# Patient Record
Sex: Male | Born: 1965 | Race: White | Hispanic: No | Marital: Single | Smoking: Current every day smoker
Health system: Southern US, Community
[De-identification: ages and names within clinical notes are randomized; demographics above are authoritative.]

## PROBLEM LIST (undated history)

## (undated) DIAGNOSIS — R479 Unspecified speech disturbances: Secondary | ICD-10-CM

## (undated) DIAGNOSIS — I200 Unstable angina: Secondary | ICD-10-CM

## (undated) HISTORY — DX: Unspecified speech disturbances: R47.9

## (undated) HISTORY — DX: Unstable angina: I20.0

## (undated) HISTORY — PX: EXTERNAL EAR SURGERY: SHX627

---

## 2017-02-23 ENCOUNTER — Emergency Department (HOSPITAL_COMMUNITY)
Admission: EM | Admit: 2017-02-23 | Discharge: 2017-02-23 | Disposition: A | Discharge status: Home / Self Care | Payer: Medicaid Other | Attending: Emergency Medicine | Admitting: Emergency Medicine

## 2017-02-23 ENCOUNTER — Emergency Department (HOSPITAL_COMMUNITY): Payer: Medicaid Other

## 2017-02-23 ENCOUNTER — Encounter (HOSPITAL_COMMUNITY): Payer: Self-pay

## 2017-02-23 DIAGNOSIS — Z7982 Long term (current) use of aspirin: Secondary | ICD-10-CM | POA: Insufficient documentation

## 2017-02-23 DIAGNOSIS — F17210 Nicotine dependence, cigarettes, uncomplicated: Secondary | ICD-10-CM | POA: Insufficient documentation

## 2017-02-23 DIAGNOSIS — H8110 Benign paroxysmal vertigo, unspecified ear: Secondary | ICD-10-CM | POA: Diagnosis not present

## 2017-02-23 DIAGNOSIS — Z79899 Other long term (current) drug therapy: Secondary | ICD-10-CM | POA: Insufficient documentation

## 2017-02-23 DIAGNOSIS — R42 Dizziness and giddiness: Secondary | ICD-10-CM | POA: Diagnosis present

## 2017-02-23 LAB — URINALYSIS, ROUTINE W REFLEX MICROSCOPIC
Bilirubin Urine: NEGATIVE
GLUCOSE, UA: NEGATIVE mg/dL
HGB URINE DIPSTICK: NEGATIVE
Ketones, ur: NEGATIVE mg/dL
LEUKOCYTES UA: NEGATIVE
Nitrite: NEGATIVE
PH: 8 (ref 5.0–8.0)
Protein, ur: NEGATIVE mg/dL
Specific Gravity, Urine: 1.008 (ref 1.005–1.030)

## 2017-02-23 LAB — BASIC METABOLIC PANEL
Anion gap: 6 (ref 5–15)
BUN: 8 mg/dL (ref 6–20)
CHLORIDE: 106 mmol/L (ref 101–111)
CO2: 29 mmol/L (ref 22–32)
CREATININE: 0.85 mg/dL (ref 0.61–1.24)
Calcium: 9.3 mg/dL (ref 8.9–10.3)
GFR calc Af Amer: >60 mL/min (ref >60)
GFR calc non Af Amer: >60 mL/min (ref >60)
Glucose, Bld: 102 mg/dL — ABNORMAL HIGH (ref 65–99)
POTASSIUM: 4.2 mmol/L (ref 3.5–5.1)
SODIUM: 141 mmol/L (ref 135–145)

## 2017-02-23 LAB — CBC
HEMATOCRIT: 41.9 % (ref 39.0–52.0)
Hemoglobin: 14.4 g/dL (ref 13.0–17.0)
MCH: 31.1 pg (ref 26.0–34.0)
MCHC: 34.4 g/dL (ref 30.0–36.0)
MCV: 90.5 fL (ref 78.0–100.0)
Platelets: 191 10*3/uL (ref 150–400)
RBC: 4.63 MIL/uL (ref 4.22–5.81)
RDW: 13.1 % (ref 11.5–15.5)
WBC: 5.7 10*3/uL (ref 4.0–10.5)

## 2017-02-23 MED ORDER — MECLIZINE HCL 25 MG PO TABS: 25.0000 mg | ORAL_TABLET | Freq: Three times a day (TID) | ORAL | 0 refills | Status: DC | PRN

## 2017-02-23 MED ORDER — MECLIZINE HCL 25 MG PO TABS
25.0000 mg | ORAL_TABLET | Freq: Once | ORAL | Status: AC
  Administered 2017-02-23: 25 mg via ORAL
  Filled 2017-02-23: qty 1

## 2017-02-23 NOTE — ED Provider Notes (Signed)
[facility]-EMERGENCY DEPT Provider Note   CSN: [id number] Arrival date & time: 02/23/17  1449     History   Chief Complaint Chief Complaint  Patient presents with  . Dizziness    HPI Willie Price is a 51 y.o. male with no significant History who presents with complaints of dizziness for the past 1.5 years. Patient is poor historian. He describes intermittent dizziness, at times positional. He does not see a primary care physician, nor has he had this dizziness worked up in the past. He denies any weakness, numbness or tingling, recent falls or trauma.He decided to come in today because he was "seeing stars".   HPI  History reviewed. No pertinent past medical history.  There are no active problems to display for this patient.   History reviewed. No pertinent surgical history.     Home Medications    Prior to Admission medications   Medication Sig Start Date End Date Taking? Authorizing Provider  Aspirin-Acetaminophen (GOODY BODY PAIN) 500-325 MG PACK Take 1 Package by mouth as needed (pain).   Yes [provider]  meclizine (ANTIVERT) 25 MG tablet Take 1 tablet (25 mg total) by mouth 3 (three) times daily as needed for dizziness. 02/23/17   Wynelle ClevelandMizera, Jeff Mccallum, MD    Family History History reviewed. No pertinent family history.  Social History Social History  Substance Use Topics  . Smoking status: Current Every Day Smoker    Packs/day: 0.50  . Smokeless tobacco: Current User    Types: Chew  . Alcohol use Yes     Comment: occ     Allergies   Patient has no known allergies.   Review of Systems Review of Systems  Constitutional: Negative for chills and fever.  HENT: Negative for ear pain and sore throat.   Eyes: Negative for pain and visual disturbance.  Respiratory: Negative for cough and shortness of breath.   Cardiovascular: Negative for chest pain and palpitations.  Gastrointestinal: Negative for abdominal pain and vomiting.  Genitourinary: Negative  for dysuria and hematuria.  Musculoskeletal: Negative for arthralgias and back pain.  Skin: Negative for color change and rash.  Neurological: Positive for dizziness. Negative for seizures and syncope.  All other systems reviewed and are negative.    Physical Exam Updated Vital Signs BP 115/72   Pulse 79   Temp 97.8 F (36.6 C)   Resp 20   SpO2 99%   Physical Exam  Constitutional: He is oriented to person, place, and time. He appears well-developed and well-nourished.  HENT:  Head: Normocephalic and atraumatic.  Eyes: Conjunctivae are normal.  Neck: Neck supple.  Cardiovascular: Normal rate and regular rhythm.   No murmur heard. Pulmonary/Chest: Effort normal and breath sounds normal. No respiratory distress.  Abdominal: Soft. There is no tenderness.  Musculoskeletal: He exhibits no edema.  Neurological: He is alert and oriented to person, place, and time. He has normal strength. No cranial nerve deficit or sensory deficit. He displays a negative Romberg sign. Coordination and gait normal. GCS eye subscore is 4. GCS verbal subscore is 5. GCS motor subscore is 6.  Horizontal nystagmus elicited with changes in position.   Skin: Skin is warm and dry.  Psychiatric: He has a normal mood and affect.  Nursing note and vitals reviewed.    ED Treatments / Results  Labs (all labs ordered are listed, but only abnormal results are displayed) Labs Reviewed  BASIC METABOLIC PANEL - Abnormal; Notable for the following:       Result  Value   Glucose, Bld 102 (*)    All other components within normal limits  URINALYSIS, ROUTINE W REFLEX MICROSCOPIC - Abnormal; Notable for the following:    Color, Urine STRAW (*)    All other components within normal limits  CBC  CBG MONITORING, ED    EKG  EKG Interpretation None       Radiology Ct Head Wo Contrast  Result Date: 02/23/2017 CLINICAL DATA:  Dizziness EXAM: CT HEAD WITHOUT CONTRAST TECHNIQUE: Contiguous axial images were  obtained from the base of the skull through the vertex without intravenous contrast. COMPARISON:  None. FINDINGS: Brain: No acute territorial infarction, hemorrhage or intracranial mass is seen. The ventricles are nonenlarged. Vascular: No hyperdense vessels. Scattered calcifications at the carotid siphons. Skull: Normal. Negative for fracture or focal lesion. Sinuses/Orbits: Completely opacified left maxillary sinus. Mucosal thickening in the ethmoid and sphenoid sinuses. No acute orbital abnormality. Other: None IMPRESSION: No CT evidence for acute intracranial abnormality. Electronically Signed   By: Jasmine Pang M.D.   On: 02/23/2017 22:33    Procedures Procedures (including critical care time)  Medications Ordered in ED Medications  meclizine (ANTIVERT) tablet 25 mg (25 mg Oral Given 02/23/17 2048)     Initial Impression / Assessment and Plan / ED Course  I have reviewed the triage vital signs and the nursing notes.  Pertinent labs & imaging results that were available during my care of the patient were reviewed by me and considered in my medical decision making (see chart for details).    Patient is a 51 year old male who presents with complaints of intermittent dizziness for the past 1.5 years. Patient is somewhat poor historian. He arrived hemodynamically stable, in no acute distress. Exam as above, significant for positional reproducing symptoms with horizontal nystagmus.  Exam and nature of symptoms more consistent with peripheral vertigo. However given patient is poor historian, and duration of symptoms, CT head obtained, which was negative for acute findings. Patient's symptoms improved with meclizine. He was able to walk without difficulty. Patient given referral for primary care physician, as he would benefit for some exercises for BPPV and close follow up. Return precautions discussed. Patient in agreement with plan at time of discharge.  Patient and plan of care discussed with  Attending physician, Dr. Jacqulyn Bath.    Final Clinical Impressions(s) / ED Diagnoses   Final diagnoses:  BPPV (benign paroxysmal positional vertigo), unspecified laterality    New Prescriptions Discharge Medication List as of 02/23/2017 10:47 PM    START taking these medications   Details  meclizine (ANTIVERT) 25 MG tablet Take 1 tablet (25 mg total) by mouth 3 (three) times daily as needed for dizziness., Starting Thu 02/23/2017, Print            Wynelle Cleveland, MD 02/24/17 1610    Maia Plan, MD 02/24/17 [phone]

## 2017-02-23 NOTE — ED Notes (Signed)
Pt departed in NAD.  

## 2017-02-23 NOTE — ED Notes (Signed)
ED Provider at bedside. 

## 2017-02-23 NOTE — ED Triage Notes (Signed)
[facility]- pt here from home complaining of dizziness. Pt has had intermittent dizziness X1 year. Pt states he was watching his tv show today and noticed dizziness. Pt aoX4, no unilateral weakness noted. Vitals stable with EMS.

## 2017-02-23 NOTE — ED Notes (Signed)
Pt able to ambulate to bathroom and back without difficulty and only standby assistance. MD informed.

## 2017-02-23 NOTE — ED Notes (Signed)
Patient transported to CT 

## 2017-02-23 NOTE — Discharge Instructions (Signed)
Your work up was reassuring today. Please call number provided to set up a primary care doctor. Return to ED with any new or worsening symptoms.

## 2017-06-16 ENCOUNTER — Encounter (HOSPITAL_COMMUNITY): Payer: Self-pay

## 2017-06-16 ENCOUNTER — Emergency Department (HOSPITAL_COMMUNITY): Payer: Medicaid Other

## 2017-06-16 ENCOUNTER — Emergency Department (HOSPITAL_COMMUNITY)
Admission: EM | Admit: 2017-06-16 | Discharge: 2017-06-16 | Disposition: A | Discharge status: Home / Self Care | Payer: Medicaid Other | Attending: Emergency Medicine | Admitting: Emergency Medicine

## 2017-06-16 ENCOUNTER — Other Ambulatory Visit: Payer: Self-pay

## 2017-06-16 DIAGNOSIS — M25562 Pain in left knee: Secondary | ICD-10-CM | POA: Diagnosis present

## 2017-06-16 DIAGNOSIS — F17200 Nicotine dependence, unspecified, uncomplicated: Secondary | ICD-10-CM | POA: Diagnosis not present

## 2017-06-16 DIAGNOSIS — Z79899 Other long term (current) drug therapy: Secondary | ICD-10-CM | POA: Insufficient documentation

## 2017-06-16 DIAGNOSIS — Y999 Unspecified external cause status: Secondary | ICD-10-CM | POA: Insufficient documentation

## 2017-06-16 DIAGNOSIS — Y939 Activity, unspecified: Secondary | ICD-10-CM | POA: Diagnosis not present

## 2017-06-16 DIAGNOSIS — M25561 Pain in right knee: Secondary | ICD-10-CM | POA: Insufficient documentation

## 2017-06-16 DIAGNOSIS — Y929 Unspecified place or not applicable: Secondary | ICD-10-CM | POA: Insufficient documentation

## 2017-06-16 MED ORDER — CYCLOBENZAPRINE HCL 10 MG PO TABS: 10.0000 mg | ORAL_TABLET | Freq: Two times a day (BID) | ORAL | 0 refills | Status: DC | PRN

## 2017-06-16 MED ORDER — IBUPROFEN 800 MG PO TABS
800.0000 mg | ORAL_TABLET | Freq: Once | ORAL | Status: AC
  Administered 2017-06-16: 800 mg via ORAL
  Filled 2017-06-16: qty 1
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
## 2017-06-16 NOTE — ED Notes (Signed)
Pt is ambulatory with a steady gait.  
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
## 2017-07-13 ENCOUNTER — Ambulatory Visit: Payer: Medicaid Other | Attending: Orthopaedic Surgery

## 2017-07-13 DIAGNOSIS — R252 Cramp and spasm: Secondary | ICD-10-CM | POA: Insufficient documentation

## 2017-07-13 DIAGNOSIS — M545 Low back pain, unspecified: Secondary | ICD-10-CM

## 2017-07-13 DIAGNOSIS — M25661 Stiffness of right knee, not elsewhere classified: Secondary | ICD-10-CM | POA: Insufficient documentation

## 2017-07-13 DIAGNOSIS — R262 Difficulty in walking, not elsewhere classified: Secondary | ICD-10-CM | POA: Diagnosis present

## 2017-07-13 DIAGNOSIS — M62830 Muscle spasm of back: Secondary | ICD-10-CM | POA: Diagnosis present
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
## 2017-07-25 ENCOUNTER — Ambulatory Visit: Payer: Medicaid Other

## 2017-07-25 DIAGNOSIS — M545 Low back pain, unspecified: Secondary | ICD-10-CM

## 2017-07-25 DIAGNOSIS — M62830 Muscle spasm of back: Secondary | ICD-10-CM

## 2017-07-25 DIAGNOSIS — R262 Difficulty in walking, not elsewhere classified: Secondary | ICD-10-CM

## 2017-07-25 DIAGNOSIS — R252 Cramp and spasm: Secondary | ICD-10-CM

## 2017-07-25 DIAGNOSIS — M25661 Stiffness of right knee, not elsewhere classified: Secondary | ICD-10-CM

## 2017-07-25 NOTE — Therapy (Signed)
[facility] Out[facility] Rehabilitation [facility] [address] [place], [place], [address] Phone: [phone]   Fax:  [phone]  Physical Therapy Treatment  Patient Details  Name: Willie Price MRN: [id number] Date of Birth: [birth year] Referring Provider: Marcene Corning, MD   Encounter Date: 07/25/2017  PT End of Session - 07/25/17 1028    Visit Number  2    Number of Visits  12    Date for PT Re-Evaluation  09/22/17    Authorization Type  Medicaid    Authorization - Visit Number  1    Authorization - Number of Visits  3    PT Start Time  1030    PT Stop Time  1112    PT Time Calculation (min)  42 min    Activity Tolerance  Patient tolerated treatment well;Patient limited by pain    Behavior During Therapy  [facility] for tasks assessed/performed       History reviewed. No pertinent past medical history.  History reviewed. No pertinent surgical history.  There were no vitals filed for this visit.  Subjective Assessment - 07/25/17 1035    Subjective  Doing some better but still very painful.     Currently in Pain?  Yes    Pain Score  7     Pain Location  Back and knee    Pain Orientation  Right    Pain Type  Acute pain    Pain Onset  More than a month ago    Pain Frequency  Constant    Aggravating Factors   walk , bending back and knee    Pain Relieving Factors  meds , heat                      [facility] Adult PT Treatment/Exercise - 07/25/17 0001      Exercises   Exercises  Knee/Hip      Knee/Hip Exercises: Aerobic   Nustep  UE and LE  L4  5 min      Knee/Hip Exercises: Prone   Hamstring Curl  15 reps    Hamstring Curl Limitations  RT/LT     Straight Leg Raises  Right;Left;10 reps      Moist Heat Therapy   Number Minutes Moist Heat  12 Minutes    Moist Heat Location  Lumbar Spine;Knee      Manual Therapy   Manual Therapy  Passive ROM;Soft tissue mobilization;Myofascial release;Joint mobilization    Joint Mobilization  Gr 2 Pa  glides lumbar to mid thoracic spine. all tneder    Soft tissue mobilization  to QL and abdominals and paraspinals    Myofascial Release  to lower and upper back     Passive ROM  RT hip extension to full ROM                PT Short Term Goals - 07/13/17 1625      PT SHORT TERM GOAL #1   Title  he sill be independent with initial HEP     Baseline  no program    Time  3    Period  Weeks    Status  New      PT SHORT TERM GOAL #2   Title  He will be able to flex RT knee to 1540 degrees with mild to mod pain    Baseline  mod to severe pain with bending knee. 9/10 and max ROM to 135 degrees    Time  3  Period  Weeks    Status  New      PT SHORT TERM GOAL #3   Title  He will report back pain decr to moderate in nature 5/10    Baseline  Pain 9/10 at eval    Time  3    Period  Weeks    Status  New        PT Long Term Goals - 07/13/17 1627      PT LONG TERM GOAL #1   Title  He will report independence with all hEP issued     Baseline  independent with initial HEP    Time  10    Period  Weeks    Status  New      PT LONG TERM GOAL #2   Title  He will reports back pain as intermittant and 1-2 max with bending    Baseline  9/10 pain per report with bending    Time  10    Period  Weeks    Status  New      PT LONG TERM GOAL #3   Title  He will be able to sit on both hips with 1-2 max back pain.     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 He is walking better now and is able to sit more erect though reports pain not really improved.     PT Treatment/Interventions  Moist Heat;Ultrasound;Therapeutic exercise;Patient/family education;Manual techniques;Dry needling;Passive range of motion;Taping    PT Next Visit Plan  heat/ES  with manual and ROM, HEP if tolerated, taping    Consulted and Agree with Plan of Care  Patient       Patient will benefit from skilled therapeutic intervention in order to improve the following deficits and impairments:  Decreased activity tolerance, Decreased range of motion, Difficulty walking, Increased muscle spasms, Pain  Visit Diagnosis: Acute left-sided low back pain without sciatica  Difficulty in walking, not elsewhere classified  Muscle spasm of back  Cramp and spasm  Stiffness of right knee, not elsewhere classified     Problem List There are no active problems to display for this patient.   Caprice RedChasse, Joelle Roswell M  PT 07/25/2017, 1:01 PM  [facility] [address] [place], [place], [address] Phone: [phone]   Fax:  [phone]  Name: Willie Price MRN: [id number] Date of Birth: [birth year]

## 2017-08-01 ENCOUNTER — Ambulatory Visit: Payer: Medicaid Other

## 2017-08-01 DIAGNOSIS — R262 Difficulty in walking, not elsewhere classified: Secondary | ICD-10-CM

## 2017-08-01 DIAGNOSIS — R252 Cramp and spasm: Secondary | ICD-10-CM

## 2017-08-01 DIAGNOSIS — M545 Low back pain, unspecified: Secondary | ICD-10-CM

## 2017-08-01 DIAGNOSIS — M62830 Muscle spasm of back: Secondary | ICD-10-CM

## 2017-08-01 DIAGNOSIS — M25661 Stiffness of right knee, not elsewhere classified: Secondary | ICD-10-CM

## 2017-08-01 NOTE — Therapy (Signed)
[facility] Outpatient Rehabilitation [facility] [address] [place], [place], [address] Phone: [phone]   Fax:  [phone]  Physical Therapy Treatment  Patient Details  Name: Willie Price MRN: [id number] Date of Birth: 1965/11/04 Referring Provider: Marcene Corning, MD   Encounter Date: 08/01/2017  PT End of Session - 08/01/17 1113    Visit Number  3    Number of Visits  12    Date for PT Re-Evaluation  09/22/17    Authorization Type  Medicaid    Authorization - Visit Number  2    Authorization - Number of Visits  3    PT Start Time  0930    PT Stop Time  1015    PT Time Calculation (min)  45 min    Activity Tolerance  Patient tolerated treatment well;Patient limited by pain    Behavior During Therapy  [facility] for tasks assessed/performed       No past medical history on file.  No past surgical history on file.  There were no vitals filed for this visit.  Subjective Assessment - 08/01/17 0940    Subjective  No improvement from last time. 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     Currently in Pain?  Yes    Pain Score  7     Pain Location  Knee    Pain Orientation  Right    Pain Descriptors / Indicators  -- clamp on knee , knife    Pain Type  Acute pain    Aggravating Factors   walk , bend knee    Pain Relieving Factors  meds heat    Pain Score  7    Pain Location  Back    Pain Orientation  Left    Pain Descriptors / Indicators  -- stab    Pain Type  Acute pain    Pain Onset  More than a month ago    Pain Frequency  Constant    Aggravating Factors   sit /lying , getting up fromsit    Pain Relieving Factors  nothing                      [facility] Adult PT Treatment/Exercise - 08/01/17 0001      Knee/Hip Exercises: Stretches   Other Knee/Hip Stretches  Knee to chest LT and RT x 30 sec x 2      Knee/Hip Exercises: Aerobic   Nustep  UE and LE  L4  5 min      Knee/Hip Exercises: Supine   Straight Leg Raises  Right;Left;2  sets;5 reps      Modalities   Modalities  Electrical Stimulation      Moist Heat Therapy   Number Minutes Moist Heat  15 Minutes    Moist Heat Location  Lumbar Spine;Knee      Electrical Stimulation   Electrical Stimulation Location  15    Electrical Stimulation Action  IFC    Electrical Stimulation Parameters  to tolerance    Electrical Stimulation Goals  Pain      Manual Therapy   Joint Mobilization  Gr 2 Pa glides lumbar to mid thoracic spine. all tneder    Soft tissue mobilization  to QL and abdominals and paraspinals    Myofascial Release  to lower and upper back                PT Short Term Goals - 08/01/17 1117  PT SHORT TERM GOAL #1   Title  he sill be independent with initial HEP     Status  On-going      PT SHORT TERM GOAL #2   Title  He will be able to flex RT knee to 145 degrees with mild to mod pain    Baseline  mod to severe pain with bending knee. 9/10     Status  On-going      PT SHORT TERM GOAL #3   Title  He will report back pain decr to moderate in nature 5/10    Baseline  pain 7/10 , 9 at times with stretching    Status  On-going        PT Long Term Goals - 07/13/17 1627      PT LONG TERM GOAL #1   Title  He will report independence with all hEP issued     Baseline  independent with initial HEP    Time  10    Period  Weeks    Status  New      PT LONG TERM GOAL #2   Title  He will reports back pain as intermittant and 1-2 max with bending    Baseline  9/10 pain per report with bending    Time  10    Period  Weeks    Status  New      PT LONG TERM GOAL #3   Title  He will be able to sit on both hips with 1-2 max back pain.     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  Suggested that cleaning gutters was risky with the pain level he reports. He may be somewhat less sensitive as he did not jump as frequently with touch to back.   Assess next time and ask for extension. MAy need assessment to RT knee as he continue to report popping and pain with movement    PT Treatment/Interventions  Moist Heat;Ultrasound;Therapeutic exercise;Patient/family education;Manual techniques;Dry needling;Passive range of motion;Taping    PT Next Visit Plan  heat/ES  with manual and ROM, HEP if tolerated,  ready for stretching at home add next time    Consulted and Agree with Plan of Care  Patient       Patient will benefit from skilled therapeutic intervention in order to improve the following deficits and impairments:  Decreased activity tolerance, Decreased range of motion, Difficulty walking, Increased muscle spasms, Pain  Visit Diagnosis: Acute left-sided low back pain without sciatica  Difficulty in walking, not elsewhere classified  Muscle spasm of back  Cramp and spasm  Stiffness of right knee, not elsewhere classified     Problem List There are no active problems to display for this patient.   Caprice Red  PT 08/01/2017, 11:19 AM  [facility] [address] [place], [place], [address] Phone: [phone]   Fax:  [phone]  Name: Willie Price MRN: [id number] Date of Birth: 06/15/1966

## 2017-08-08 ENCOUNTER — Ambulatory Visit: Payer: Medicaid Other

## 2017-08-08 DIAGNOSIS — M545 Low back pain, unspecified: Secondary | ICD-10-CM

## 2017-08-08 DIAGNOSIS — M25661 Stiffness of right knee, not elsewhere classified: Secondary | ICD-10-CM

## 2017-08-08 DIAGNOSIS — R262 Difficulty in walking, not elsewhere classified: Secondary | ICD-10-CM

## 2017-08-08 DIAGNOSIS — R252 Cramp and spasm: Secondary | ICD-10-CM

## 2017-08-08 DIAGNOSIS — M62830 Muscle spasm of back: Secondary | ICD-10-CM

## 2017-08-08 NOTE — Therapy (Signed)
[facility] Outpatient Rehabilitation [facility] [address] [place], [place], [address] Phone: [phone]   Fax:  [phone]  Physical Therapy Treatment  Patient Details  Name: Willie Price MRN: [id number] Date of Birth: 1966/01/11 Referring Provider: Marcene Corning, MD   Encounter Date: 08/08/2017  PT End of Session - 08/08/17 0934    Visit Number  4    Number of Visits  12    Date for PT Re-Evaluation  09/22/17    Authorization Type  Medicaid    Authorization - Visit Number  3    Authorization - Number of Visits  3    PT Start Time  0933    PT Stop Time  1023    PT Time Calculation (min)  50 min    Activity Tolerance  Patient tolerated treatment well;Patient limited by pain    Behavior During Therapy  Willie Price for tasks assessed/performed       No past medical history on file.  No past surgical history on file.  There were no vitals filed for this visit.  Subjective Assessment - 08/08/17 0941    Subjective  Pain some better but still sore.     Currently in Pain?  Yes    Pain Score  7     Pain Location  Knee    Pain Orientation  Right    Pain Descriptors / Indicators  -- feel likes clamp    Pain Type  Acute pain    Pain Onset  More than a month ago    Pain Frequency  Constant    Aggravating Factors   walk / bend knee    Pain Relieving Factors  meds , heat    Multiple Pain Sites  Yes    Pain Score  9    Pain Location  Back    Pain Orientation  Left    Pain Descriptors / Indicators  Throbbing    Pain Type  Acute pain    Pain Onset  More than a month ago    Pain Frequency  Constant    Aggravating Factors   turning in bed    Pain Relieving Factors  nothing                      [facility] Adult PT Treatment/Exercise - 08/08/17 0001      Knee/Hip Exercises: Aerobic   Nustep  UE and LE  L4  5 min      Knee/Hip Exercises: Supine   Short Arc Quad Sets  Right;15 reps      Knee/Hip Exercises: Prone   Straight Leg Raises  Right;Left;10  reps      Moist Heat Therapy   Number Minutes Moist Heat  15 Minutes    Moist Heat Location  Lumbar Spine;Knee      Manual Therapy   Joint Mobilization  Gr 2 Pa glides lumbar to mid thoracic spine. all tneder    Soft tissue mobilization  to QL and abdominals and paraspinals    Myofascial Release  to lower and upper back     Passive ROM  SLR and hip adduction adn adduction RT and LT                PT Short Term Goals - 08/08/17 1012      PT SHORT TERM GOAL #1   Title  he will be independent with initial HEP     Baseline  he declines exercise due to pain  Status  On-going      PT SHORT TERM GOAL #2   Title  He will be able to flex RT knee to 145 degrees with mild to mod pain    Baseline  mod to severe pain with bending knee. 9/10     Status  On-going      PT SHORT TERM GOAL #3   Title  He will report back pain decr to moderate in nature 5/10    Baseline  9/10 today    Status  On-going        PT Long Term Goals - 08/08/17 1014      PT LONG TERM GOAL #1   Title  He will report independence with all hEP issued     Baseline  declined HEP due to pain.     Status  On-going      PT LONG TERM GOAL #2   Title  He will reports back pain as intermittant and 1-2 max with bending    Baseline  9/10 pain per report with bending, constant pain    Status  On-going      PT LONG TERM GOAL #3   Title  He will be able to sit on both hips with 1-2 max back pain.     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 posture looks good . He continues to report popping /catch in RT knee with pain.  He may need some imaging o kne. Nothing done in PT eases pain and we ahve not done anything aggresive.  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   Dry needling may be of some benefit . he will not do exercise at home due to pain.     PT Frequency  2x / week    PT Duration  4 weeks    PT Treatment/Interventions  Moist Heat;Ultrasound;Therapeutic exercise;Patient/family education;Manual techniques;Dry needling;Passive range of motion;Taping;Electrical Stimulation    PT Next Visit Plan  heat/ES  with manual and ROM, HEP if tolerated,   stretching at home add next time if able .   BACK APPEARS TO BE SPASM SO DRY NEEDLE IS AN OPTION. HE AGREED , KNEE APPEARS TO NEED MORE ASSESSMENT ,     PT Home Exercise Plan  Attempted to have him do some SAQ and QS but no carryover .    Consulted and Agree with Plan of Care  Patient;Family member/caregiver       Patient will benefit from skilled therapeutic intervention in order to improve the following deficits and impairments:  Decreased activity tolerance, Decreased range of  motion, Difficulty walking, Increased muscle spasms, Pain  Visit Diagnosis: Acute left-sided low back pain without sciatica  Difficulty in walking, not elsewhere classified  Cramp and spasm  Stiffness of right knee, not elsewhere classified  Muscle spasm of back     Problem List There are no active problems to display for this patient.   Caprice RedChasse, Armeda Plumb M  PT 08/08/2017, 10:17 AM  [facility] [address] [place], [place], [address] Phone: [phone]   Fax:  [phone]  Name: Willie Price MRN: [id number] Date of Birth: 14-Jan-1966

## 2017-08-22 ENCOUNTER — Ambulatory Visit: Payer: Medicaid Other | Attending: Orthopaedic Surgery | Admitting: Physical Therapy

## 2017-08-22 ENCOUNTER — Encounter: Payer: Self-pay | Admitting: Physical Therapy

## 2017-08-22 DIAGNOSIS — M545 Low back pain, unspecified: Secondary | ICD-10-CM

## 2017-08-22 DIAGNOSIS — R252 Cramp and spasm: Secondary | ICD-10-CM | POA: Diagnosis present

## 2017-08-22 DIAGNOSIS — M62830 Muscle spasm of back: Secondary | ICD-10-CM | POA: Insufficient documentation

## 2017-08-22 DIAGNOSIS — M25661 Stiffness of right knee, not elsewhere classified: Secondary | ICD-10-CM | POA: Diagnosis present

## 2017-08-22 DIAGNOSIS — R262 Difficulty in walking, not elsewhere classified: Secondary | ICD-10-CM | POA: Insufficient documentation

## 2017-08-22 NOTE — Therapy (Signed)
[facility] Outpatient Rehabilitation [facility] [address] [place], [place], [address] Phone: [phone]   Fax:  [phone]  Physical Therapy Treatment  Patient Details  Name: Willie Price MRN: [id number] Date of Birth: 12-Sep-1965 Referring Provider: Marcene Corning, MD   Encounter Date: 08/22/2017  PT End of Session - 08/22/17 1116    Visit Number  5    Number of Visits  12    Authorization Type  Medicaid    PT Start Time  1100    PT Stop Time  1145    PT Time Calculation (min)  45 min    Activity Tolerance  Patient tolerated treatment well;Patient limited by pain    Behavior During Therapy  [facility] for tasks assessed/performed       History reviewed. No pertinent past medical history.  History reviewed. No pertinent surgical history.  There were no vitals filed for this visit.  Subjective Assessment - 08/22/17 1112    Subjective  Pt arriving to therapy reporting 8/10 pain in his left lower back, flank area. Pt reports he has been trying to do some exercises at home.     Limitations  Walking;Sitting    Currently in Pain?  Yes    Pain Score  8     Pain Location  Back    Pain Orientation  Left    Pain Descriptors / Indicators  Aching;Discomfort    Pain Frequency  Constant    Aggravating Factors   standing, walking    Pain Relieving Factors  meds, heat    Multiple Pain Sites  No                      [facility] Adult PT Treatment/Exercise - 08/22/17 0001      Knee/Hip Exercises: Stretches   Active Hamstring Stretch  Both;3 reps;30 seconds;Limitations;Other (comment)    Active Hamstring Stretch Limitations  seated    Other Knee/Hip Stretches  SKTC x 3 reps holding 20 seconds each      Knee/Hip Exercises: Aerobic   Nustep  L5 x 7 minutes, UE and LE's      Knee/Hip Exercises: Standing   Other Standing Knee Exercises  sit to stand x 10 using bilateral UE's      Knee/Hip Exercises: Supine   Short Arc Quad Sets  Right;15 reps    Straight  Leg Raises  Right;2 sets;10 reps      Knee/Hip Exercises: Prone   Straight Leg Raises  Right;Left;10 reps      Moist Heat Therapy   Number Minutes Moist Heat  15 Minutes    Moist Heat Location  Lumbar Spine;Knee      Manual Therapy   Joint Mobilization  Gr 2 Pa glides lumbar to mid thoracic spine. Pt extremely tender with spasms noted along L2-3    Soft tissue mobilization  to QL and abdominals and paraspinals             PT Education - 08/22/17 1154    Education provided  Yes    Education Details  Discussed dry needling and advised pt to revist the topic with his primary therapist. Added Hamstring stretch to HEP    Person(s) Educated  Patient    Methods  Explanation;Demonstration;Handout;Verbal cues    Comprehension  Verbalized understanding;Returned demonstration;Verbal cues required       PT Short Term Goals - 08/08/17 1012      PT SHORT TERM GOAL #1   Title  he will be  independent with initial HEP     Baseline  he declines exercise due to pain    Status  On-going      PT SHORT TERM GOAL #2   Title  He will be able to flex RT knee to 145 degrees with mild to mod pain    Baseline  mod to severe pain with bending knee. 9/10     Status  On-going      PT SHORT TERM GOAL #3   Title  He will report back pain decr to moderate in nature 5/10    Baseline  9/10 today    Status  On-going        PT Long Term Goals - 08/22/17 1126      PT LONG TERM GOAL #1   Title  He will report independence with all hEP issued     Baseline  declined HEP due to pain.     Time  10    Period  Weeks    Status  On-going      PT LONG TERM GOAL #2   Title  He will reports back pain as intermittant and 1-2 max with bending    Baseline  9/10 pain per report with bending, constant pain    Time  10    Period  Weeks    Status  On-going      PT LONG TERM GOAL #3   Title  He will be able to sit on both hips with 1-2 max back pain.     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 pt still amb with mild antalgic gait with increased difficulty with sit to stand activities. Continued to discuss possible dry needling. Continue skilled PT to progress pt toward goals.     PT Frequency  2x / week    PT Treatment/Interventions  Moist Heat;Ultrasound;Therapeutic exercise;Patient/family education;Manual techniques;Dry needling;Passive range of motion;Taping;Electrical Stimulation    PT Next Visit Plan  heat/ES  with manual and ROM, HEP if tolerated,   stretching at home add next time if able .   BACK APPEARS TO BE SPASM SO DRY NEEDLE IS AN OPTION. HE AGREED , KNEE APPEARS TO NEED MORE ASSESSMENT ,     PT Home Exercise Plan  Attempted to have him do some SAQ and QS but no carryover. Added seated hamstring stretch.    Consulted and Agree with Plan of Care  Patient       Patient will benefit from  skilled therapeutic intervention in order to improve the following deficits and impairments:  Decreased activity tolerance, Decreased range of motion, Difficulty walking, Increased muscle spasms, Pain  Visit Diagnosis: Acute left-sided low back pain without sciatica  Difficulty in walking, not elsewhere classified  Cramp and spasm  Stiffness of right knee, not elsewhere classified  Muscle spasm of back     Problem List There are no active problems to display  for this patient.   Sharmon LeydenJennifer R Haik Mahoney, MPT 08/22/2017, 11:56 AM  [facility] [address] [place], [place], [address] Phone: [phone]   Fax:  [phone]  Name: Gwenyth Bouillonhomas L Wargo MRN: [id number] Date of Birth: December 14, 1965

## 2017-08-29 ENCOUNTER — Ambulatory Visit: Payer: Medicaid Other

## 2017-08-29 DIAGNOSIS — M545 Low back pain, unspecified: Secondary | ICD-10-CM

## 2017-08-29 DIAGNOSIS — M25661 Stiffness of right knee, not elsewhere classified: Secondary | ICD-10-CM

## 2017-08-29 DIAGNOSIS — R252 Cramp and spasm: Secondary | ICD-10-CM

## 2017-08-29 DIAGNOSIS — M62830 Muscle spasm of back: Secondary | ICD-10-CM

## 2017-08-29 DIAGNOSIS — R262 Difficulty in walking, not elsewhere classified: Secondary | ICD-10-CM

## 2017-08-29 NOTE — Therapy (Signed)
[facility] [address] [place], [place], [address] Phone: [phone]   Fax:  [phone]  Physical Therapy Treatment  Patient Details  Name: Willie Price MRN: [id number] Date of Birth: 1965-08-04 Referring Provider: Marcene CorningPeter Dalldorf, MD   Encounter Date: 08/29/2017  PT End of Session - 08/29/17 1009    Visit Number  6    Number of Visits  12    Date for PT Re-Evaluation  09/22/17    Authorization Type  Medicaid    Authorization Time Period  08/21/17 through 3/10    Authorization - Visit Number  2    Authorization - Number of Visits  8    PT Start Time  1030    PT Stop Time  1140    PT Time Calculation (min)  70 min    Activity Tolerance  Patient tolerated treatment well;Patient limited by pain    Behavior During Therapy  [facility] assessed/performed       No past medical history on file.  No past surgical history on file.  There were no vitals filed for this visit.  Subjective Assessment - 08/29/17 1043    Subjective  Shot in knee with significant decr pain . but still pops,  back moderate  LT lower back    Currently in Pain?  Yes    Pain Score  --  moderate    Pain Location  Back    Pain Orientation  Left    Pain Descriptors / Indicators  Aching    Pain Type  -- sub acute    Pain Onset  More than a month ago    Pain Frequency  Constant    Aggravating Factors   activity on feet    Pain Relieving Factors  heat meds    Pain Location  Knee    Pain Orientation  Right    Pain Descriptors / Indicators  Sore    Pain Type  -- sub acute    Pain Onset  More than a month ago    Pain Frequency  Constant    Aggravating Factors   walking    Pain Relieving Factors  injection                      [facility] Adult PT Treatment/Exercise - 08/29/17 0001      Knee/Hip Exercises: Stretches   Active Hamstring Stretch  Both;3 reps;30 seconds;Limitations;Other (comment)    Other Knee/Hip Stretches  SKTC x 3 reps  holding 20 seconds each      Knee/Hip Exercises: Aerobic   Nustep  L5 x 7 minutes, UE and LE's      Knee/Hip Exercises: Supine   Short Arc Quad Sets  Right;15 reps    Straight Leg Raises  Right;2 sets;10 reps      Knee/Hip Exercises: Sidelying   Hip ABduction  Right;15 reps      Knee/Hip Exercises: Prone   Straight Leg Raises  20 reps;Right;Left      Moist Heat Therapy   Number Minutes Moist Heat  15 Minutes    Moist Heat Location  Lumbar Spine;Knee      Manual Therapy   Joint Mobilization  Gr 2 Pa glides lumbar to mid thoracic spine. Pt extremely tender with spasms noted along L2-3    Soft tissue mobilization  to QL and abdominals and paraspinals prgressive pressure on TP Lt lumbar spine.     Myofascial Release  to lower back  PT Short Term Goals - 08/29/17 1150      PT SHORT TERM GOAL #1   Title  he will be independent with initial HEP     Status  On-going      PT SHORT TERM GOAL #2   Title  He will be able to flex RT knee to 145 degrees with mild to mod pain    Status  Achieved      PT SHORT TERM GOAL #3   Title  He will report back pain decr to moderate in nature 5/10    Status  Achieved        PT Long Term Goals - 08/29/17 1150      PT LONG TERM GOAL #1   Title  He will report independence with all hEP issued     Baseline  initial HEP to be establishe as pain more manageable    Status  On-going      PT LONG TERM GOAL #2   Title  He will reports back pain as intermittant and 1-2 max with bending    Baseline  5/10 constant    Status  On-going      PT LONG TERM GOAL #3   Title  He will be able to sit on both hips with 1-2 max back pain.     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 ROM no change and active movement all planes limited . Forefoot and toes mobile except great toe DF/Pf.     Clinical Presentation  Stable    PT Treatment/Interventions  Moist Heat;Ultrasound;Therapeutic exercise;Patient/family education;Manual techniques;Dry needling;Passive range of motion;Taping;Electrical Stimulation    PT Next Visit Plan  heat  with manual and ROM, HEP if tolerated,   stretching at home add next time if able .   BACK APPEARS TO BE SPASM SO DRY NEEDLE IS AN OPTION. HE AGREED   will look to this if extension authorized .       PT Home Exercise Plan  Attempted to have him do some SAQ and QS but no carryover. Added seated hamstring stretch.    Consulted and Agree with Plan of Care  Patient       Patient will benefit from skilled therapeutic intervention in order to improve the following deficits and impairments:  Decreased activity tolerance, Decreased range of motion, Difficulty walking, Increased muscle spasms, Pain  Visit Diagnosis: Acute left-sided low back pain without sciatica  Difficulty in walking, not elsewhere classified  Cramp and spasm  Stiffness of right knee, not elsewhere classified  Muscle spasm of back     Problem List There are no active problems to display for this patient.   Sherie Dobrowolski,  Bertram Millard  PT 08/29/2017, 11:55 AM  [facility] [address] [place], [place], [address] Phone: [phone]   Fax:  [phone]  Name: Willie Price MRN: [id number] Date of Birth: [birth year]

## 2017-09-05 ENCOUNTER — Ambulatory Visit: Payer: Medicaid Other

## 2017-09-07 ENCOUNTER — Ambulatory Visit: Payer: Medicaid Other

## 2017-09-07 DIAGNOSIS — R262 Difficulty in walking, not elsewhere classified: Secondary | ICD-10-CM

## 2017-09-07 DIAGNOSIS — M545 Low back pain, unspecified: Secondary | ICD-10-CM

## 2017-09-07 DIAGNOSIS — M62830 Muscle spasm of back: Secondary | ICD-10-CM

## 2017-09-07 DIAGNOSIS — R252 Cramp and spasm: Secondary | ICD-10-CM

## 2017-09-07 DIAGNOSIS — M25661 Stiffness of right knee, not elsewhere classified: Secondary | ICD-10-CM

## 2017-09-07 NOTE — Patient Instructions (Signed)
Straight Leg Raise    Tighten stomach and slowly raise locked right leg ____ inches from floor. Repeat _30___ times per set. Do ___1_ sets per session. Do ___2Hip Abduction: Modified    Lying on left side  raise top leg from pillow, rotating slightly out. Repeat __30__ times per set. Do  1 ____ sets per session. Do __1-2Clam    Lie on side, legs bent 90. Open top knee to ceiling, rotating leg outward. Touch toes to ankle of bottom leg. Close knees, rotating leg inward. Maintain hip position. Repeat _30___ times. Repeat on other side. Do _1-2Hip Extension: Prone    Tighten gluteal muscle. Lift one leg _30__ times. Restabilize pelvis. Repeat with other leg. Keep pelvis still. Be sure pelvis does not rotate and back does not arch. Do __1_ sets, ___1-2KNEE: Flexion - Prone    Bend knee. Raise heel toward buttocks. Do not raise hips. _30__ reps per set, __1-2PELVIC TILT: Posterior    Tighten abdominals, flatten low back. 15__ reps per set, _1-2__ sets per day, ___ days per week  HOLD 5-10 SECLower Trunk Rotation Stretch    Keeping back flat and feet together, rotate knees to left side. Hold __15__ seconds. Repeat __3__ times per set. Do __1__ sets per session. Do ___2_ sessions per day.  http://orth.exer.us/122   Copyright  VHI. All rights reserved.   Copyright  VHI. All rights reserved.  _ sets per day, ___ days per week   Copyright  VHI. All rights reserved.   times per day.  http://ss.exer.us/64   Copyright  VHI. All rights reserved.  ___ sessions per day.  http://pm.exer.us/68   Copyright  VHI. All rights reserved.  __ sessions per day.  http://orth.exer.us/704   Copyright  VHI. All rights reserved.  _ sessions per day.  http://orth.exer.us/1102   Copyright  VHI. All rights reserved.

## 2017-09-07 NOTE — Therapy (Addendum)
[place], [place], [address] Phone: [phone]   Fax:  [phone]  Physical Therapy Treatment  Patient Details  Name: Willie Price MRN: [id number] Date of Birth: 08/08/1965 Referring Provider: Melrose Nakayama, MD   Encounter Date: 09/07/2017  PT End of Session - 09/07/17 0958    Visit Number  7  Of 15 with extension   Date for PT Re-Evaluation  09/22/17    Authorization Type  Medicaid    Authorization Time Period  08/21/17 through 3/10    Authorization - Visit Number  3    Authorization - Number of Visits  8    PT Start Time  0957    PT Stop Time  1040    PT Time Calculation (min)  43 min    Activity Tolerance  Patient tolerated treatment well;Patient limited by pain    Behavior During Therapy  [facility] for tasks assessed/performed       History reviewed. No pertinent past medical history.  History reviewed. No pertinent surgical history.  There were no vitals filed for this visit.  Subjective Assessment - 09/07/17 0959    Subjective  back still sore , some better but not alot.  Muscle tightness .  Doesn't stretch much. Knee still pops.    Currently in Pain?  Yes    Pain Score  5     Pain Location  Back    Pain Orientation  Left    Pain Descriptors / Indicators  Aching    Pain Type  -- sub acute    Pain Onset  More than a month ago    Pain Frequency  Constant    Aggravating Factors   activity    Pain Relieving Factors  heat , meds    Pain Score  3    Pain Location  Knee    Pain Orientation  Right    Pain Descriptors / Indicators  Sore    Pain Type  -- sub acute    Pain Onset  More than a month ago    Pain Frequency  Constant    Aggravating Factors   wallking    Pain Relieving Factors  injection                      [facility] Adult PT Treatment/Exercise - 09/07/17 0001      Knee/Hip Exercises: Aerobic   Nustep  L5 x 7 minutes,  LE's      Knee/Hip Exercises: Supine   Straight Leg Raises  Right;2 sets;10 reps      Moist Heat Therapy   Moist Heat Location  Lumbar Spine;Knee supine      See Pt instructions for HEP issued today       PT Education - 09/07/17 1031    Education provided  Yes    Education Details  HEP    Person(s) Educated  Patient    Methods  Explanation;Tactile cues;Verbal cues;Handout    Comprehension  Returned demonstration;Verbalized understanding       PT Short Term Goals - 09/07/17 1005      PT SHORT TERM GOAL #1   Title  he will be independent with initial HEP     Baseline  only walks    Status  On-going      PT SHORT TERM GOAL #2   Title  He will be able to flex RT knee to 145 degrees with mild to mod pain  Baseline  145 degrees    Status  Achieved      PT SHORT TERM GOAL #3   Title  He will report back pain decr to moderate in nature 5/10    Baseline  5/10 today    Status  Achieved        PT Long Term Goals - 09/07/17 1008      PT LONG TERM GOAL #1   Title  He will report independence with all hEP issued     Baseline  only walking   HEP initiated today    Status  On-going      PT LONG TERM GOAL #2   Title  He will reports back pain as intermittant and 1-2 max with bending    Baseline  5/10 constant    Status  On-going      PT LONG TERM GOAL #3   Title  He will be able to sit on both hips with 1-2 max back pain.     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 LTG are ongoing due to continue moderate levels of pain and continued popping in RT knee with pain. He reports a deeper pain in knee has not been able to complete authorized visits in time frame indicated . He will have a 4th visit before 09/17/17. Marland Kitchen  He does not drive and  He is progressing so may benefit form another series of visits.  If not after this he will be dischared    Clinical Decision Making  Low    PT Frequency  2x / week    PT Duration  4 weeks    PT Treatment/Interventions  Moist Heat;Ultrasound;Therapeutic exercise;Patient/family education;Manual techniques;Dry needling;Passive range of motion;Taping;Electrical Stimulation    PT Next Visit Plan  heat, manual , review HEP. add as tolerated    PT Home Exercise Plan  Attempted to have him do some SAQ and QS but no carryover. Added seated hamstring stretch.,   SLR supine Lt side and prone . Prone knee fleion ,     Consulted and Agree with Plan of Care  Patient       Patient will benefit from skilled therapeutic intervention in order to improve the following deficits and impairments:  Decreased activity tolerance, Decreased range of motion, Difficulty walking, Increased muscle spasms, Pain  Visit Diagnosis: Acute left-sided low back pain without sciatica  Difficulty in walking, not elsewhere classified  Cramp and spasm  Stiffness of right knee, not elsewhere classified  Muscle spasm of back     Problem List There are no  active problems to display for this patient.   Darrel Hoover  PT 09/07/2017, 10:43 AM  [facility] [address] [place], [place], [address] Phone: [phone]   Fax:  [phone]  Name: Willie Price MRN: [id number] Date of Birth: 1965/12/05

## 2017-09-15 ENCOUNTER — Encounter: Payer: Self-pay | Admitting: Physical Therapy

## 2017-09-15 ENCOUNTER — Ambulatory Visit: Payer: Medicaid Other | Attending: Orthopaedic Surgery | Admitting: Physical Therapy

## 2017-09-15 DIAGNOSIS — R252 Cramp and spasm: Secondary | ICD-10-CM | POA: Insufficient documentation

## 2017-09-15 DIAGNOSIS — R262 Difficulty in walking, not elsewhere classified: Secondary | ICD-10-CM | POA: Insufficient documentation

## 2017-09-15 DIAGNOSIS — M545 Low back pain, unspecified: Secondary | ICD-10-CM

## 2017-09-15 DIAGNOSIS — M62830 Muscle spasm of back: Secondary | ICD-10-CM

## 2017-09-15 DIAGNOSIS — M25661 Stiffness of right knee, not elsewhere classified: Secondary | ICD-10-CM | POA: Diagnosis present

## 2017-09-15 NOTE — Therapy (Signed)
[facility] [address] [place], [place], [address] Phone: [phone]   Fax:  [phone]  Physical Therapy Treatment  Patient Details  Name: Willie Price MRN: [id number] Date of Birth: [birth year] Referring Provider: Marcene Corning, MD   Encounter Date: 09/15/2017  PT End of Session - 09/15/17 0909    Visit Number  8    Number of Visits  15    Date for PT Re-Evaluation  10/06/17    Authorization Type  Medicaid    Authorization Time Period  08/21/17 through 3/10    Authorization - Visit Number  3    Authorization - Number of Visits  8    PT Start Time  0847    PT Stop Time  0937    PT Time Calculation (min)  50 min    Activity Tolerance  Patient tolerated treatment well;Patient limited by pain    Behavior During Therapy  [facility] for tasks assessed/performed       History reviewed. No pertinent past medical history.  History reviewed. No pertinent surgical history.  There were no vitals filed for this visit.  Subjective Assessment - 09/15/17 0903    Subjective  Pt still reporting popping and soreness in R knee. No complaints of low back pain today.     Limitations  Walking;Sitting    Diagnostic tests  xray negative    Currently in Pain?  Yes    Pain Score  4     Pain Location  Knee    Pain Orientation  Right    Pain Descriptors / Indicators  Aching;Sore    Pain Onset  More than a month ago    Pain Frequency  Intermittent    Aggravating Factors   depends on movements and certain activities    Pain Relieving Factors  meds, heat    Multiple Pain Sites  No                      [facility] Adult PT Treatment/Exercise - 09/15/17 0001      Knee/Hip Exercises: Stretches   Active Hamstring Stretch  Right;3 reps;30 seconds;Limitations    Active Hamstring Stretch Limitations  seated      Knee/Hip Exercises: Aerobic   Nustep  L5 x 7 minutes      Knee/Hip Exercises: Standing   Other Standing Knee Exercises   forward lunges x 10 with R LE with single UE support      Knee/Hip Exercises: Supine   Quad Sets  10 reps;Limitations    Quad Sets Limitations  holding 5 seconds each    Hip Adduction Isometric  10 reps;Limitations    Hip Adduction Isometric Limitations  with ball holding     Bridges  10 reps;Limitations    Bridges Limitations  holding 5 seconds each    Straight Leg Raises  Right;2 sets;10 reps      Knee/Hip Exercises: Sidelying   Hip ABduction  Right;20 reps      Knee/Hip Exercises: Prone   Straight Leg Raises  Both;20 reps      Moist Heat Therapy   Number Minutes Moist Heat  15 Minutes    Moist Heat Location  Lumbar Spine;Knee supine             PT Education - 09/15/17 0908    Education provided  Yes    Education Details  review knee exercises for home    Person(s) Educated  Patient    Methods  Explanation;Demonstration    Comprehension  Verbalized understanding;Returned demonstration       PT Short Term Goals - 09/07/17 1005      PT SHORT TERM GOAL #1   Title  he will be independent with initial HEP     Baseline  only walks    Status  On-going      PT SHORT TERM GOAL #2   Title  He will be able to flex RT knee to 145 degrees with mild to mod pain    Baseline  145 degrees    Status  Achieved      PT SHORT TERM GOAL #3   Title  He will report back pain decr to moderate in nature 5/10    Baseline  5/10 today    Status  Achieved        PT Long Term Goals - 09/15/17 0913      PT LONG TERM GOAL #1   Title  He will report independence with all hEP issued     Baseline  only walking   HEP initiated today    Time  10    Period  Weeks    Status  On-going      PT LONG TERM GOAL #2   Title  He will reports back pain as intermittant and 1-2 max with bending    Baseline  5/10 constant    Time  10    Period  Weeks    Status  On-going      PT LONG TERM GOAL #3   Title  He will be able to sit on both hips with 1-2 max back pain.     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 Pt is still progressing toward STG's and LTG's. Pt tolertating exercises well today with more concentration on pt's R knee strengthening.  Continue skilled PT.     Clinical Presentation  Stable    Clinical Decision Making  Low    Rehab Potential  Good    PT Frequency  2x / week    PT Duration  4 weeks    PT Treatment/Interventions  Moist Heat;Ultrasound;Therapeutic exercise;Patient/family education;Manual techniques;Dry needling;Passive range of motion;Taping;Electrical Stimulation    PT Next Visit Plan  heat, manual , review HEP. add as tolerated    PT Home Exercise Plan  Attempted to have him do some SAQ and QS but no carryover. Added seated hamstring stretch.,  SLR supine Lt side and prone . Prone knee fleion ,     Consulted and Agree with Plan of Care  Patient       Patient will benefit from skilled therapeutic intervention in order to improve the following deficits and impairments:  Decreased activity tolerance, Decreased range of motion, Difficulty walking, Increased muscle spasms, Pain  Visit Diagnosis: Acute left-sided low back pain without sciatica  Difficulty in walking, not elsewhere classified  Cramp and spasm  Stiffness of right knee, not elsewhere classified  Muscle spasm of  back     Problem List There are no active problems to display for this patient.   Sharmon Leyden, MPT 09/15/2017, 9:27 AM  [facility] [address] [place], [place], [address] Phone: [phone]   Fax:  [phone]  Name: Willie Price MRN: [id number] Date of Birth: [birth year]

## 2017-10-12 ENCOUNTER — Ambulatory Visit: Payer: Medicaid Other | Attending: Orthopaedic Surgery | Admitting: Physical Therapy

## 2017-10-12 ENCOUNTER — Encounter: Payer: Self-pay | Admitting: Physical Therapy

## 2017-10-12 DIAGNOSIS — M62830 Muscle spasm of back: Secondary | ICD-10-CM | POA: Insufficient documentation

## 2017-10-12 DIAGNOSIS — M545 Low back pain, unspecified: Secondary | ICD-10-CM

## 2017-10-12 DIAGNOSIS — M25661 Stiffness of right knee, not elsewhere classified: Secondary | ICD-10-CM | POA: Diagnosis present

## 2017-10-12 DIAGNOSIS — R262 Difficulty in walking, not elsewhere classified: Secondary | ICD-10-CM | POA: Diagnosis present

## 2017-10-12 DIAGNOSIS — R252 Cramp and spasm: Secondary | ICD-10-CM | POA: Insufficient documentation

## 2017-10-12 NOTE — Therapy (Addendum)
[facility] [address] [place], [place], [address] Phone: [phone]   Fax:  [phone]  Physical Therapy Treatment  Patient Details  Name: Willie Price MRN: [id number] Date of Birth: May 19, 1966 Referring Provider: Marcene Corning, MD   Encounter Date: 10/12/2017  PT End of Session - 10/12/17 1145    Visit Number  9    Number of Visits  15    Date for PT Re-Evaluation  10/18/17    Authorization Type  Medicaid    Authorization Time Period  08/21/17 through 3/10    PT Start Time  1145    PT Stop Time  1230    PT Time Calculation (min)  45 min    Activity Tolerance  Patient tolerated treatment well;Patient limited by pain    Behavior During Therapy  [facility] for tasks assessed/performed       History reviewed. No pertinent past medical history.  History reviewed. No pertinent surgical history.  There were no vitals filed for this visit.  Subjective Assessment - 10/12/17 1143    Subjective  Pt still reporting soreness in R knee. Pt feels it has improved since beginning therapy.     Limitations  Walking;Sitting    Diagnostic tests  xray negative    Currently in Pain?  Yes    Pain Score  4     Pain Location  Knee    Pain Orientation  Right    Pain Descriptors / Indicators  Sore    Pain Onset  More than a month ago    Aggravating Factors   certain activities and movements    Pain Relieving Factors  heat and rest    Multiple Pain Sites  No                       [facility] Adult PT Treatment/Exercise - 10/12/17 0001      Knee/Hip Exercises: Stretches   Active Hamstring Stretch  Right;3 reps;30 seconds;Limitations    Active Hamstring Stretch Limitations  seated      Knee/Hip Exercises: Aerobic   Nustep  L4 x 6 minutes no UE's      Knee/Hip Exercises: Standing   Knee Flexion  Strengthening;Both;15 reps    Hip ADduction  Strengthening;15 reps    Hip Extension  Both;15 reps    Forward Step Up  Both;15 reps     Functional Squat  10 reps    Other Standing Knee Exercises  forward lunges x 10 with R LE with single UE support      Knee/Hip Exercises: Supine   Quad Sets  10 reps;Limitations    Quad Sets Limitations  holding 5 seconds    Hip Adduction Isometric  10 reps;Limitations    Hip Adduction Isometric Limitations  with ball holding     Bridges  10 reps;Limitations    Bridges Limitations  holding 5 seconds each    Straight Leg Raises  Right;2 sets;15 reps;Limitations    Straight Leg Raises Limitations  2.5 lb ankle weight in second set    Straight Leg Raise with External Rotation  Strengthening;Right;5 reps increased pain      Knee/Hip Exercises: Sidelying   Hip ABduction  Right;20 reps      Knee/Hip Exercises: Prone   Straight Leg Raises  Both;20 reps      Moist Heat Therapy   Number Minutes Moist Heat  10 Minutes    Moist Heat Location  Lumbar Spine;Knee supine  PT Short Term Goals - 10/12/17 1145      PT SHORT TERM GOAL #1   Title  he will be independent with initial HEP     Baseline  only walks    Time  3    Period  Weeks    Status  On-going      PT SHORT TERM GOAL #2   Title  He will be able to flex RT knee to 145 degrees with mild to mod pain    Baseline  145 degrees    Time  3    Period  Weeks    Status  Achieved      PT SHORT TERM GOAL #3   Title  He will report back pain decr to moderate in nature 5/10    Baseline  5/10 today    Time  3    Period  Weeks    Status  Achieved        PT Long Term Goals - 10/12/17 1146      PT LONG TERM GOAL #1   Title  He will report independence with all hEP issued     Baseline  only walking   HEP initiated today    Period  Weeks    Status  On-going      PT LONG TERM GOAL #2   Title  He will reports back pain as intermittant and 1-2 max with bending    Baseline  5/10 constant    Time  10    Period  Weeks    Status  On-going      PT LONG TERM GOAL #3   Title  He will be able to sit on both hips  with 1-2 max back pain.     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 Pt's certification dates expires on 10/18/17. Pt has made progress with therapy but still progressing toward LTG's set. Pt scheduled for one visit next week which will be his last.     Rehab Potential  Good    PT Frequency  2x / week    PT Duration  4 weeks    PT Treatment/Interventions  Moist Heat;Ultrasound;Therapeutic exercise;Patient/family education;Manual techniques;Dry needling;Passive range of motion;Taping;Electrical Stimulation    PT Next Visit Plan  continue to work on R knee strengthening and stabilty, core strengthening and lumbar stretches/strengthening. Discharge next visit    PT Home Exercise Plan  SLR, lunges, squats, prone  knee flexion, hamsring stretches, LAQ    Consulted and Agree with Plan of Care  Patient       Patient will benefit from skilled therapeutic intervention in order to improve the following deficits and impairments:  Decreased activity tolerance, Decreased range of motion, Difficulty walking, Increased muscle spasms, Pain  Visit Diagnosis: Acute left-sided low back pain without sciatica  Difficulty in walking, not elsewhere classified  Cramp and spasm  Stiffness of right knee, not elsewhere classified  Muscle  spasm of back     Problem List There are no active problems to display for this patient.   Sharmon LeydenJennifer R Martin, MPT  10/12/2017, 12:49 PM  [facility] [address] [place], [place], [address] Phone: [phone]   Fax:  [phone]  Name: Gwenyth Bouillonhomas L Langhans MRN: [id number] Date of Birth: 1966-04-27    12/20/17: Pt did not show up or schedule a follow up therapy visit. Pt is discharged from outpatient PT at this time, still progressing toward his LTG's with HEP issued.   Earnstine RegalJennifer Martin,MPT 12/20/17 8:59 AM

## 2017-10-17 ENCOUNTER — Ambulatory Visit: Payer: Medicaid Other

## 2018-10-16 ENCOUNTER — Telehealth: Payer: Self-pay | Admitting: Neurology

## 2018-10-16 ENCOUNTER — Encounter: Payer: Self-pay | Admitting: Neurology

## 2018-10-16 NOTE — Telephone Encounter (Signed)
Pt returned my call. I had an extended conversation with him. Pt's meds, allergies, and PMH were updated.  Pt reports that he has tremors. Pt is right handed.  Pt reports that he is about 140 lb and he is 5'8.  I asked pt to have a pen and paper available for his appt.  Pt's phone number is [phone]. Pt's friend, Mr. Earlene Plater will help him with his virtual visit.

## 2018-10-16 NOTE — Telephone Encounter (Signed)
I called pt to discuss his appt and update pt's chart. Pt's friend/caregiver answered the phone and will ask pt to call me back.

## 2018-10-16 NOTE — Addendum Note (Signed)
Addended by: Geronimo Running A on: 10/16/2018 01:02 PM   Modules accepted: Orders

## 2018-10-16 NOTE — Telephone Encounter (Signed)
Due to current COVID 19 pandemic, our office is severely reducing in office visits for at least the next 2 weeks, in order to minimize the risk to our patients and healthcare providers.   Reached out to patient regarding moving 12/18/18 appointment up for a virtual visit. Spoke with patient's caregiver, [name] and received consent to move forward with scheduling a virtual visit. Mr. Willie Price verbalized understanding of the steps to set up the [facility] meeting, as he is familiar with helping patient with virtual appointments. I advised that he will be receiving an e-mail from our office containing further information, he agreed.   Pt understands that although there may be some limitations with this type of visit, we will take all precautions to reduce any security or privacy concerns.  Pt understands that this will be treated like an in office visit and we will file with pt's insurance, and there may be a patient responsible charge related to this service.  Pt's email is [email or web address]. Pt understands that the [employer] webex software must be downloaded and operational on the device pt plans to use for the visit.

## 2018-10-23 ENCOUNTER — Ambulatory Visit (INDEPENDENT_AMBULATORY_CARE_PROVIDER_SITE_OTHER): Payer: Medicaid Other | Admitting: Neurology

## 2018-10-23 ENCOUNTER — Encounter: Payer: Self-pay | Admitting: Neurology

## 2018-10-23 ENCOUNTER — Other Ambulatory Visit: Payer: Self-pay

## 2018-10-23 ENCOUNTER — Telehealth: Payer: Self-pay | Admitting: Neurology

## 2018-10-23 DIAGNOSIS — R251 Tremor, unspecified: Secondary | ICD-10-CM | POA: Diagnosis not present
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
## 2018-10-23 NOTE — Telephone Encounter (Signed)
LVM to schedule 6 month follow-up with NP per Dr. Frances Furbish.

## 2018-10-23 NOTE — Patient Instructions (Signed)
Given verbally, during today's virtual video-based encounter, with verbal feedback received.   

## 2018-12-18 ENCOUNTER — Ambulatory Visit: Payer: Medicaid Other | Admitting: Neurology

## 2019-07-24 ENCOUNTER — Emergency Department (HOSPITAL_COMMUNITY): Payer: Medicaid Other

## 2019-07-24 ENCOUNTER — Other Ambulatory Visit: Payer: Self-pay

## 2019-07-24 ENCOUNTER — Encounter (HOSPITAL_COMMUNITY): Payer: Self-pay | Admitting: Emergency Medicine

## 2019-07-24 ENCOUNTER — Emergency Department (HOSPITAL_COMMUNITY)
Admission: EM | Admit: 2019-07-24 | Discharge: 2019-07-24 | Disposition: A | Discharge status: Home / Self Care | Payer: Medicaid Other | Attending: Emergency Medicine | Admitting: Emergency Medicine

## 2019-07-24 DIAGNOSIS — G8929 Other chronic pain: Secondary | ICD-10-CM

## 2019-07-24 DIAGNOSIS — M25461 Effusion, right knee: Secondary | ICD-10-CM | POA: Insufficient documentation

## 2019-07-24 DIAGNOSIS — F17220 Nicotine dependence, chewing tobacco, uncomplicated: Secondary | ICD-10-CM | POA: Diagnosis not present

## 2019-07-24 DIAGNOSIS — M25561 Pain in right knee: Secondary | ICD-10-CM | POA: Insufficient documentation

## 2019-07-24 MED ORDER — MELOXICAM 15 MG PO TABS: 15.0000 mg | ORAL_TABLET | Freq: Every day | ORAL | 0 refills | Status: DC

## 2019-07-24 NOTE — ED Triage Notes (Signed)
Pt arrives via PTAR with reports of right knee pain starting today. Denies any trauma.
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
## 2019-07-24 NOTE — Progress Notes (Signed)
Orthopedic Tech Progress Note Patient Details:  Willie Price Dec 05, 1965 [id number] ED RN stopped me while I was going to assist one of his other patients and asked could I apply a KNEE IMMOBILIZER to this patient. I said yes sir. Ortho Devices Type of Ortho Device: Knee Immobilizer Ortho Device/Splint Location: RLE Ortho Device/Splint Interventions: Application, Ordered   Post Interventions Patient Tolerated: Well Instructions Provided: Care of device, Adjustment of device   Donald Pore 07/24/2019, 2:48 PM

## 2019-07-24 NOTE — ED Notes (Signed)
Pt discharge instructions reviewed with the patient. The patient verbalized understanding of instructions. Pt discharged. 

## 2019-07-24 NOTE — Discharge Instructions (Signed)
Rest and ice the knee several times a day Wear brace for support Start Meloxicam once daily for pain Follow up with Dr. Jena Gauss (orthopedics) for follow up

## 2019-12-04 ENCOUNTER — Emergency Department (HOSPITAL_COMMUNITY): Payer: Medicaid Other

## 2019-12-04 ENCOUNTER — Other Ambulatory Visit: Payer: Self-pay

## 2019-12-04 ENCOUNTER — Emergency Department (HOSPITAL_COMMUNITY)
Admission: EM | Admit: 2019-12-04 | Discharge: 2019-12-05 | Disposition: A | Discharge status: Home / Self Care | Payer: Medicaid Other | Attending: Emergency Medicine | Admitting: Emergency Medicine

## 2019-12-04 ENCOUNTER — Encounter (HOSPITAL_COMMUNITY): Payer: Self-pay

## 2019-12-04 DIAGNOSIS — W19XXXA Unspecified fall, initial encounter: Secondary | ICD-10-CM

## 2019-12-04 DIAGNOSIS — Y999 Unspecified external cause status: Secondary | ICD-10-CM | POA: Insufficient documentation

## 2019-12-04 DIAGNOSIS — W010XXA Fall on same level from slipping, tripping and stumbling without subsequent striking against object, initial encounter: Secondary | ICD-10-CM | POA: Insufficient documentation

## 2019-12-04 DIAGNOSIS — F17210 Nicotine dependence, cigarettes, uncomplicated: Secondary | ICD-10-CM | POA: Diagnosis not present

## 2019-12-04 DIAGNOSIS — Y9389 Activity, other specified: Secondary | ICD-10-CM | POA: Diagnosis not present

## 2019-12-04 DIAGNOSIS — Y929 Unspecified place or not applicable: Secondary | ICD-10-CM | POA: Diagnosis not present

## 2019-12-04 DIAGNOSIS — S2232XA Fracture of one rib, left side, initial encounter for closed fracture: Secondary | ICD-10-CM | POA: Diagnosis not present

## 2019-12-04 DIAGNOSIS — S299XXA Unspecified injury of thorax, initial encounter: Secondary | ICD-10-CM | POA: Diagnosis present

## 2019-12-04 DIAGNOSIS — Z79899 Other long term (current) drug therapy: Secondary | ICD-10-CM | POA: Diagnosis not present

## 2019-12-04 NOTE — ED Provider Notes (Signed)
[facility] DEPT Provider Note   CSN: [id number] Arrival date & time: 12/04/19  2154     History Chief Complaint  Patient presents with  . Flank Pain    Rib    CATALDO COSGRIFF is a 54 y.o. male.  54 y.o male with a PMH of slurred speech presents to the ED with a chief complaint of left flank pain status post fall.  Patient reports he was unloading stuff from a pickup truck when he suddenly lost his balance, this caused him to fall onto grass, reports most of the impact was taken by the left side of his body.  Does have pain along the left flank, this is worse with movement, worse with lifting. Also reports he he hit his head, but did not lose consciousness. He reports asking his coworkers to let him lay on the ground until he regained his bearings.  States the pain on his left flank is sharp.  He has not tried any medication for improvement in his symptoms.  Denies any headache, dizziness, vomiting, other injuries.   The history is provided by the patient.       Past Medical History:  Diagnosis Date  . Speech problem   . Unstable angina (HCC)     There are no problems to display for this patient.   Past Surgical History:  Procedure Laterality Date  . EXTERNAL EAR SURGERY         Family History  Adopted: Yes    Social History   Tobacco Use  . Smoking status: Current Every Day Smoker    Packs/day: 0.50  . Smokeless tobacco: Current User    Types: Chew  Substance Use Topics  . Alcohol use: Yes    Comment: occ  . Drug use: No    Home Medications Prior to Admission medications   Medication Sig Start Date End Date Taking? Authorizing Provider  Aspirin-Salicylamide-Caffeine (BC HEADACHE POWDER PO) Take by mouth.    [provider]  lidocaine (LIDODERM) 5 % Place 1 patch onto the skin daily for 5 days. Remove & Discard patch within 12 hours or as directed by MD 12/05/19 12/10/19  Janeece Fitting, PA-C  meloxicam (MOBIC) 15 MG  tablet Take 1 tablet (15 mg total) by mouth daily. 07/24/19   Recardo Evangelist, PA-C  naproxen (NAPROSYN) 500 MG tablet Take 1 tablet (500 mg total) by mouth 2 (two) times daily for 7 days. 12/05/19 12/12/19  Janeece Fitting, PA-C    Allergies    Patient has no known allergies.  Review of Systems   Review of Systems  Constitutional: Negative for fever.  HENT: Negative for sinus pressure.   Respiratory: Negative for shortness of breath.   Cardiovascular: Negative for chest pain.  Gastrointestinal: Negative for abdominal pain, nausea and vomiting.  Genitourinary: Positive for flank pain.  Musculoskeletal: Positive for back pain.  Skin: Negative for pallor and wound.  Neurological: Negative for dizziness, facial asymmetry, light-headedness, numbness and headaches.  All other systems reviewed and are negative.   Physical Exam Updated Vital Signs BP 103/77   Pulse (!) 56   Temp 98.5 F (36.9 C) (Oral)   Resp 17   Ht 5\' 8"  (1.727 m)   Wt 61.2 kg   SpO2 96%   BMI 20.53 kg/m   Physical Exam Vitals and nursing note reviewed.  HENT:     Head: Normocephalic.     Comments: Goose egg noted to the left parietal part of his  head.     Nose: Nose normal.     Mouth/Throat:     Mouth: Mucous membranes are moist.  Eyes:     Pupils: Pupils are equal, round, and reactive to light.     Comments: Pupils are equal and reactive.   Cardiovascular:     Rate and Rhythm: Normal rate.  Pulmonary:     Effort: Pulmonary effort is normal.     Breath sounds: No wheezing or rales.     Comments: NO absent lung sounds.  Chest:     Chest wall: Tenderness present.     Breasts:        Left: Tenderness present.       Comments: No bruising, or hematoma noted. Bony tenderness with palpation of the left ribs.  Abdominal:     General: Abdomen is flat.     Tenderness: There is abdominal tenderness. There is left CVA tenderness. There is no right CVA tenderness.       Comments: Mild ttp along the left  upper quadrant.   Musculoskeletal:        General: Tenderness present.     Cervical back: Normal range of motion and neck supple. No tenderness.  Skin:    General: Skin is warm and dry.  Neurological:     Mental Status: He is oriented to person, place, and time.     Comments: Delayed speech, at baseline per chart.  Alert, oriented, thought content appropriate. Speech is delayed without evidence of aphasia. Able to follow 2 step commands without difficulty.  Cranial Nerves:  II:  Peripheral visual fields grossly normal, pupils, round, reactive to light III,IV, VI: ptosis not present, extra-ocular motions intact bilaterally  V,VII: smile symmetric, facial light touch sensation equal VIII: hearing grossly normal bilaterally  IX,X: midline uvula rise  XI: bilateral shoulder shrug equal and strong XII: midline tongue extension  Motor:  5/5 in upper and lower extremities bilaterally including strong and equal grip strength and dorsiflexion/plantar flexion Sensory: light touch normal in all extremities.  Cerebellar: normal finger-to-nose with bilateral upper extremities, pronator drift negative Gait: normal gait and balance      ED Results / Procedures / Treatments   Labs (all labs ordered are listed, but only abnormal results are displayed) Labs Reviewed - No data to display  EKG None  Radiology DG Ribs Unilateral W/Chest Left  Result Date: 12/04/2019 CLINICAL DATA:  Pain status post fall. Left flank pain. 4 EXAM: LEFT RIBS AND CHEST - 3+ VIEW COMPARISON:  None. FINDINGS: There is an acute minimally displaced fracture of the lateral eleventh rib on the left. There is no pneumothorax. The lungs are clear. Heart size is normal. There is some pleuroparenchymal scarring at the lung apices. IMPRESSION: Acute minimally displaced fracture of the lateral eleventh rib on the left. No pneumothorax. Electronically Signed   By: Katherine Mantle M.D.   On: 12/04/2019 23:32   DG Lumbar Spine  Complete  Result Date: 12/04/2019 CLINICAL DATA:  Pain status post fall EXAM: LUMBAR SPINE - COMPLETE 4+ VIEW COMPARISON:  None. FINDINGS: There is no evidence of lumbar spine fracture. Alignment is normal. Intervertebral disc spaces are maintained. IMPRESSION: Negative. Electronically Signed   By: Katherine Mantle M.D.   On: 12/04/2019 23:34    Procedures Procedures (including critical care time)  Medications Ordered in ED Medications  lidocaine (LIDODERM) 5 % 1 patch (1 patch Transdermal Patch Applied 12/05/19 0119)  oxyCODONE-acetaminophen (PERCOCET/[name]) 5-325 MG per tablet 1 tablet (1 tablet Oral  Given 12/05/19 0120)    ED Course  I have reviewed the triage vital signs and the nursing notes.  Pertinent labs & imaging results that were available during my care of the patient were reviewed by me and considered in my medical decision making (see chart for details).    MDM Rules/Calculators/A&P   Patient with past medical history of slurred speech presents to the ED with complaints of left leg pain status post fall.  Patient reports he was on top of a pickup truck when he suddenly lost his balance and fell landing on the left side of his body, reports pain along the left upper quadrant more so on the rib distribution.  During evaluation there is no hematoma, bruising noted to the left rib region.  Patient reports the pain is worse with movement along with taking a deep breath.  On auscultation lungs are clear, no absent breath sounds, he is satting at 97% on room air.  Does report he struck his head but did not lose consciousness of the event.  States he laid on the ground as he told coworkers to let hope to gather his bearings prior to standing.  This pain is exacerbated with movement, has not taken any medication for improvement in his symptoms.  Patient for rib fracture.  X-ray of the left ribs with chest views show a acute nondisplaced fracture of the 11th rib, no other  abnormalities, no pneumothorax, no other acute pathology. Xray of his lumbar spine without any acute pathology.  No other rib fracture at this time.  Provided with Percocet along with Lidoderm patch to help with pain control. He is neurologically intact, is ambulatory in the ED, [facility] [place] rule will not obtain imaging at this time.   2:49 AM patient provided with Percocet for pain today, also given incentive spirometer, was able to obtain up to 8000.  Discussed using this at home.  He understands and agrees to management, patient stable for discharge.  Portions of this note were generated with Scientist, clinical (histocompatibility and immunogenetics). Dictation errors may occur despite best attempts at proofreading.  Final Clinical Impression(s) / ED Diagnoses Final diagnoses:  Closed fracture of one rib of left side, initial encounter  Fall, initial encounter    Rx / DC Orders ED Discharge Orders         Ordered    naproxen (NAPROSYN) 500 MG tablet  2 times daily     12/05/19 0117    lidocaine (LIDODERM) 5 %  Every 24 hours     12/05/19 0117           Claude Manges, PA-C 12/05/19 0250    Alvira Monday, MD 12/06/19 1510

## 2019-12-04 NOTE — ED Triage Notes (Signed)
Patient arrived with complaints of right rib pain after falling off the bed of a stationary truck today at noon. States he hit his head but declines any LOC.

## 2019-12-05 MED ORDER — LIDOCAINE 5 % EX PTCH: 1.0000 | MEDICATED_PATCH | CUTANEOUS | 0 refills | Status: AC

## 2019-12-05 MED ORDER — LIDOCAINE 5 % EX PTCH
1.0000 | MEDICATED_PATCH | CUTANEOUS | Status: DC
  Administered 2019-12-05: 1 via TRANSDERMAL
  Filled 2019-12-05: qty 1

## 2019-12-05 MED ORDER — OXYCODONE-ACETAMINOPHEN 5-325 MG PO TABS
1.0000 | ORAL_TABLET | Freq: Once | ORAL | Status: AC
  Administered 2019-12-05: 1 via ORAL
  Filled 2019-12-05: qty 1

## 2019-12-05 MED ORDER — NAPROXEN 500 MG PO TABS
500.0000 mg | ORAL_TABLET | Freq: Two times a day (BID) | ORAL | 0 refills | Status: AC
Start: 2019-12-05 — End: 2019-12-12

## 2019-12-05 NOTE — ED Notes (Signed)
Patient was able to use Incentive Spirometer to 1000.

## 2019-12-05 NOTE — Discharge Instructions (Addendum)
Your xray today showed a fracture of your LEFT 11th rib. I have provided a short prescription for pain control, take this medication as directed.  I have also provided an incentive spirometer, follow directions on how to use this device.   A second prescription for a lidoderm patch has been provided, please apply this to the left side of your rib area.   Follow up with your primary care physician as needed.

## 2020-10-28 ENCOUNTER — Ambulatory Visit (HOSPITAL_COMMUNITY)
Admission: EM | Admit: 2020-10-28 | Discharge: 2020-10-28 | Disposition: A | Discharge status: Home / Self Care | Payer: Medicaid Other | Attending: Emergency Medicine | Admitting: Emergency Medicine

## 2020-10-28 ENCOUNTER — Other Ambulatory Visit: Payer: Self-pay

## 2020-10-28 ENCOUNTER — Encounter (HOSPITAL_COMMUNITY): Payer: Self-pay

## 2020-10-28 DIAGNOSIS — M25522 Pain in left elbow: Secondary | ICD-10-CM | POA: Diagnosis not present

## 2020-10-28 DIAGNOSIS — M778 Other enthesopathies, not elsewhere classified: Secondary | ICD-10-CM | POA: Diagnosis not present

## 2020-10-28 MED ORDER — MELOXICAM 7.5 MG PO TABS
7.5000 mg | ORAL_TABLET | Freq: Every day | ORAL | 0 refills | Status: AC
Start: 2020-10-28 — End: ?

## 2020-10-28 NOTE — ED Provider Notes (Signed)
[facility]    CSN: [id number] Arrival date & time: 10/28/20  4081      History   Chief Complaint Chief Complaint  Patient presents with  . Elbow Pain    HPI Willie Price is a 55 y.o. male.   Patient here for evaluation of left elbow pain and swelling that been ongoing for the past day or 2.  He reports pain started while he was sitting.  Denies any trauma, injury, or other precipitating event.  Reports pain worse with movement but does have full range of motion.  Has not taken any OTC medications or treatments.   Denies any fevers, chest pain, shortness of breath, N/V/D, numbness, tingling, weakness, abdominal pain, or headaches.   ROS: As per HPI, all other pertinent ROS negative    The history is provided by the patient.    Past Medical History:  Diagnosis Date  . Speech problem   . Unstable angina (HCC)     There are no problems to display for this patient.   Past Surgical History:  Procedure Laterality Date  . EXTERNAL EAR SURGERY         Home Medications    Prior to Admission medications   Medication Sig Start Date End Date Taking? Authorizing Provider  meloxicam (MOBIC) 7.5 MG tablet Take 1 tablet (7.5 mg total) by mouth daily. 10/28/20  Yes Ivette Loyal, NP  Aspirin-Salicylamide-Caffeine (BC HEADACHE POWDER PO) Take by mouth.    [provider]    Family History Family History  Adopted: Yes    Social History Social History   Tobacco Use  . Smoking status: Current Every Day Smoker    Packs/day: 0.50  . Smokeless tobacco: Current User    Types: Chew  Substance Use Topics  . Alcohol use: Yes    Comment: occ  . Drug use: No     Allergies   Patient has no known allergies.   Review of Systems Review of Systems  Musculoskeletal: Positive for arthralgias and joint swelling.  All other systems reviewed and are negative.    Physical Exam Triage Vital Signs ED Triage Vitals  Enc Vitals Group     BP 10/28/20  1002 112/75     Pulse Rate 10/28/20 1002 61     Resp 10/28/20 1002 18     Temp 10/28/20 1002 97.9 F (36.6 C)     Temp Source 10/28/20 1002 Oral     SpO2 10/28/20 1002 98 %     Weight --      Height --      Head Circumference --      Peak Flow --      Pain Score 10/28/20 1001 3     Pain Loc --      Pain Edu? --      Excl. in GC? --    No data found.  Updated Vital Signs BP 112/75 (BP Location: Right Arm)   Pulse 61   Temp 97.9 F (36.6 C) (Oral)   Resp 18   SpO2 98%   Visual Acuity Right Eye Distance:   Left Eye Distance:   Bilateral Distance:    Right Eye Near:   Left Eye Near:    Bilateral Near:     Physical Exam Vitals and nursing note reviewed.  Constitutional:      General: He is not in acute distress.    Appearance: Normal appearance. He is not ill-appearing, toxic-appearing or diaphoretic.  HENT:  Head: Normocephalic and atraumatic.  Eyes:     Conjunctiva/sclera: Conjunctivae normal.  Cardiovascular:     Rate and Rhythm: Normal rate.     Pulses: Normal pulses.  Pulmonary:     Effort: Pulmonary effort is normal.  Abdominal:     General: Abdomen is flat.  Musculoskeletal:        General: Normal range of motion.     Left elbow: Swelling present. No deformity or effusion. Normal range of motion. Tenderness present in lateral epicondyle.     Cervical back: Normal range of motion.     Comments: Pain with resisted wrist extension with elbow in full extension, pain with passive wrist flexion with elbow in full extension  Skin:    General: Skin is warm and dry.  Neurological:     General: No focal deficit present.     Mental Status: He is alert and oriented to person, place, and time.  Psychiatric:        Mood and Affect: Mood normal.      UC Treatments / Results  Labs (all labs ordered are listed, but only abnormal results are displayed) Labs Reviewed - No data to display  EKG   Radiology No results found.  Procedures Procedures  (including critical care time)  Medications Ordered in UC Medications - No data to display  Initial Impression / Assessment and Plan / UC Course  I have reviewed the triage vital signs and the nursing notes.  Pertinent labs & imaging results that were available during my care of the patient were reviewed by me and considered in my medical decision making (see chart for details).     Assessment negative for red flags or concerns.  Likely elbow tendonitis.  Recommend conservative management using meloxicam daily and RICE.  Patient may follow-up with Ortho or sports medicine if symptoms do not improve.  Work note supplied to patient for rest for the next few days.  Follow-up as needed.   Final Clinical Impressions(s) / UC Diagnoses   Final diagnoses:  Elbow tendonitis  Left elbow pain     Discharge Instructions     Take the Mobic once a day for the next 10-14 days.    Use RICE for the next 10-14 days:  Rest Ice for 10-15 minutes every 4-6 hours as needed for pain and swelling Compression- you can use an ace bandage or sling/splint as needed for comfort Elevate above your heart while sitting or laying down.   Return or go to the Emergency Department if symptoms worsen or do not improve in the next few days.   You may want to go to an Orthopedic or Sports medicine doctor if your symptoms do not improve.      ED Prescriptions    Medication Sig Dispense Auth. Provider   meloxicam (MOBIC) 7.5 MG tablet Take 1 tablet (7.5 mg total) by mouth daily. 30 tablet Ivette Loyal, NP     PDMP not reviewed this encounter.   Ivette Loyal, NP 10/28/20 1030

## 2020-10-28 NOTE — Discharge Instructions (Signed)
Take the Mobic once a day for the next 10-14 days.    Use RICE for the next 10-14 days:  Rest Ice for 10-15 minutes every 4-6 hours as needed for pain and swelling Compression- you can use an ace bandage or sling/splint as needed for comfort Elevate above your heart while sitting or laying down.   Return or go to the Emergency Department if symptoms worsen or do not improve in the next few days.   You may want to go to an Orthopedic or Sports medicine doctor if your symptoms do not improve.

## 2020-10-28 NOTE — ED Triage Notes (Signed)
Pt reports pain and swelling in left elbow x 2-3 days. Pain is worse with elbow extension. Pt has not taken any OTC med for pain. Pt denies any fall, injury.

## 2021-01-05 IMAGING — CR DG RIBS W/ CHEST 3+V*L*
5 series · 5 of 5 positions shown · non-contrast
Comparison: None.

CLINICAL DATA: Pain status post fall. Left flank pain. 4

EXAM:
LEFT RIBS AND CHEST - 3+ VIEW

[w chest pa]
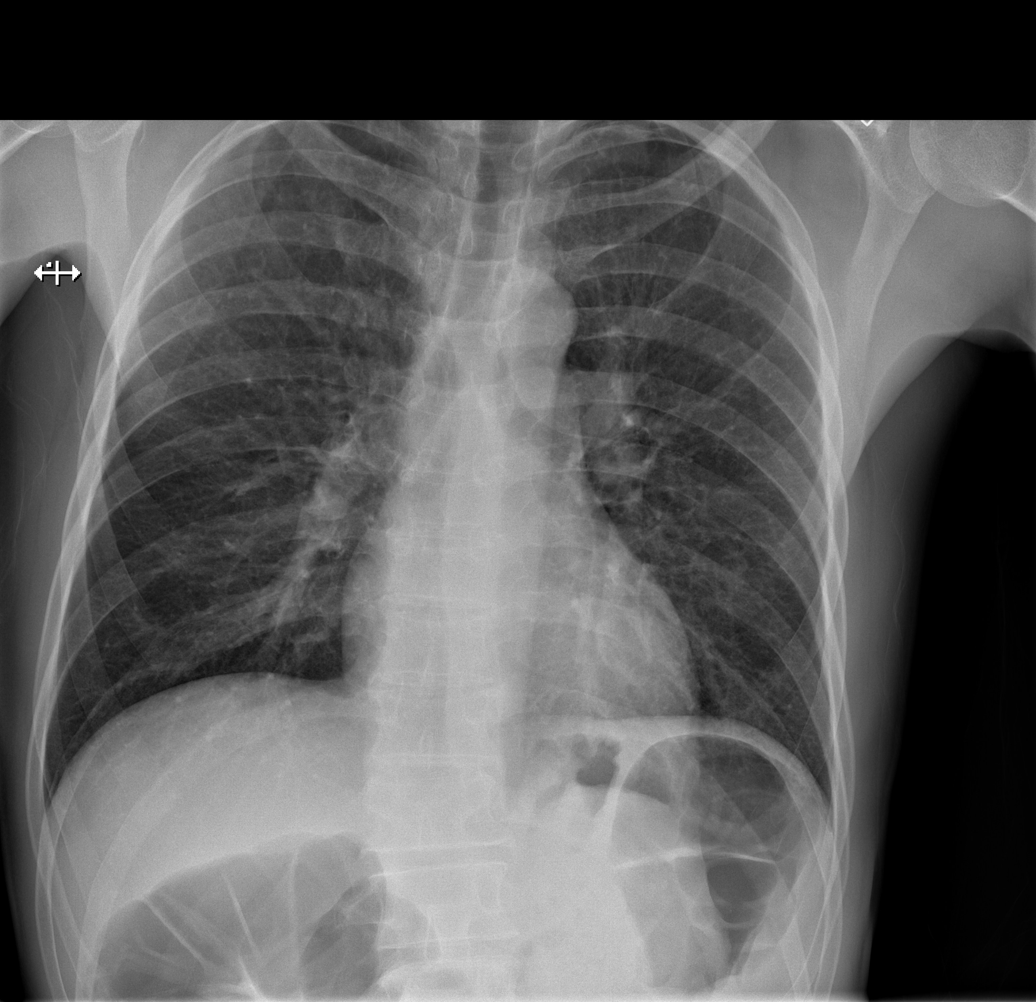

[t ribs lpo left (1 of 4)]
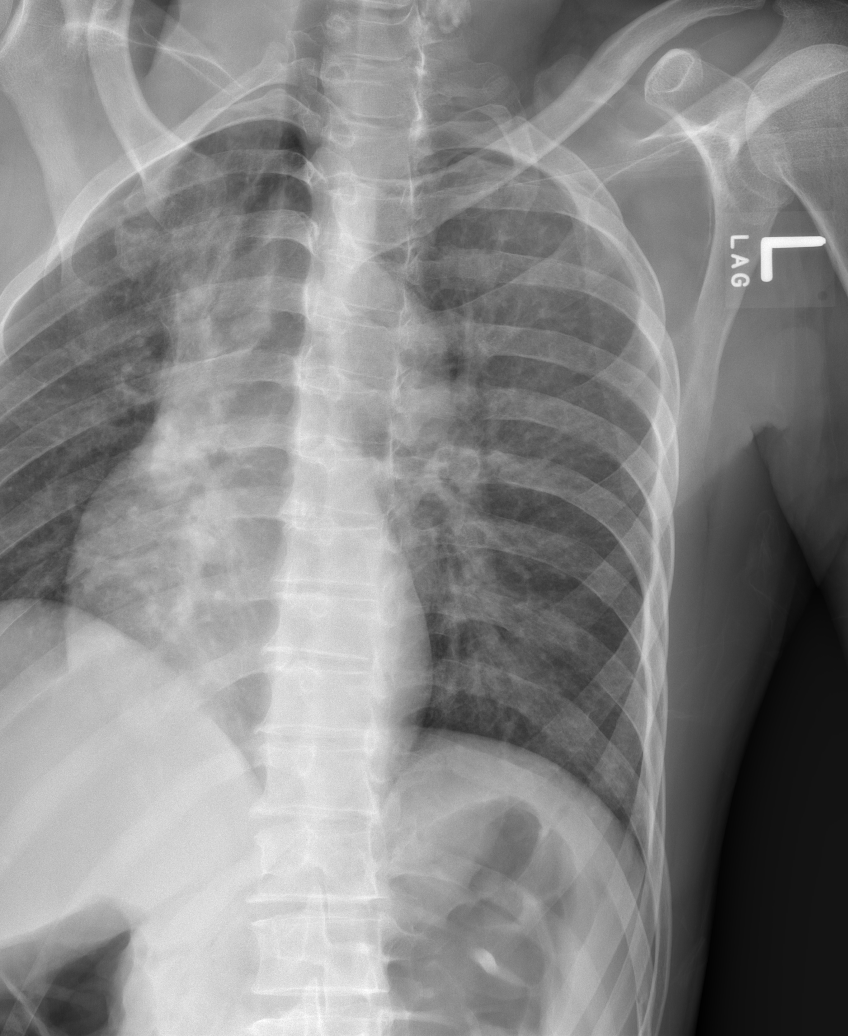

[t ribs lpo left (2 of 4)]
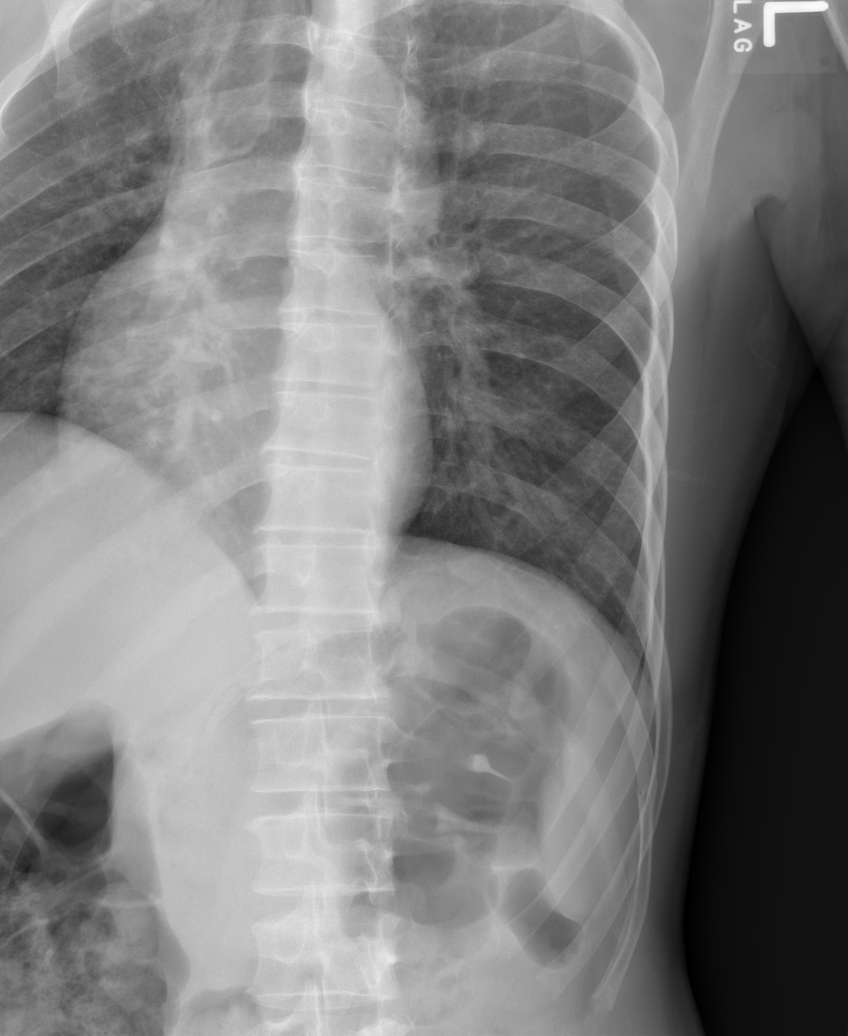

[t ribs lpo left (3 of 4)]
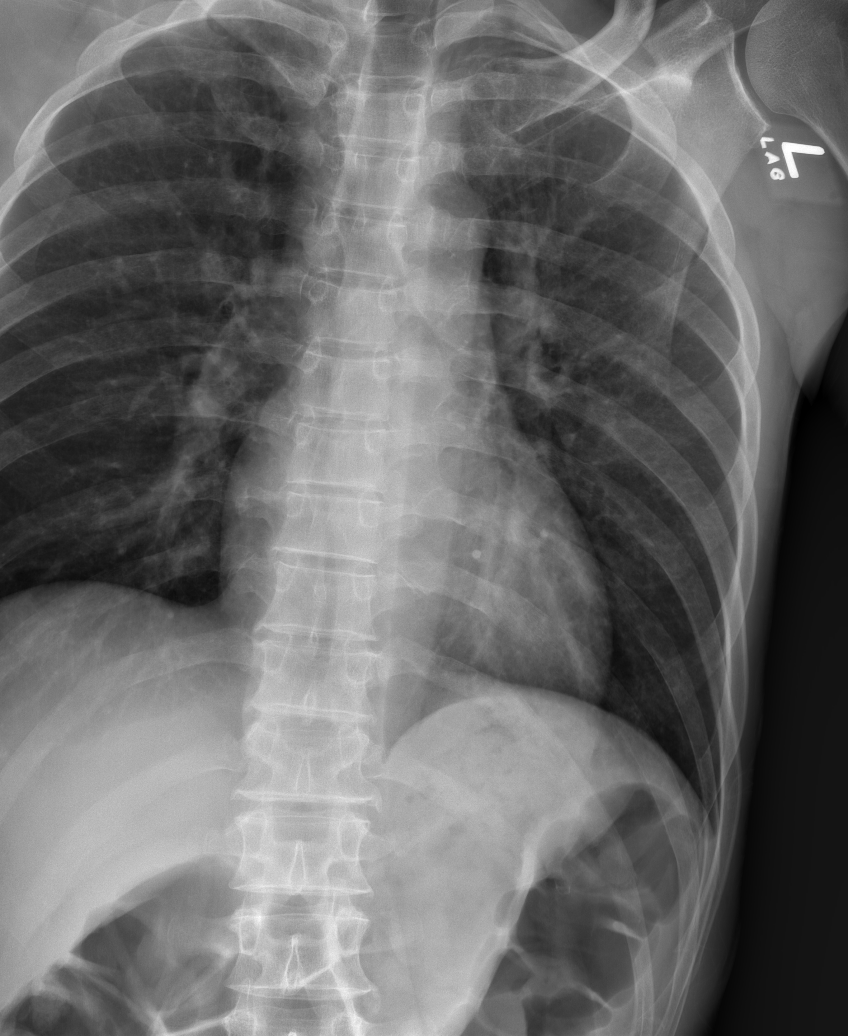

[t ribs lpo left (4 of 4)]
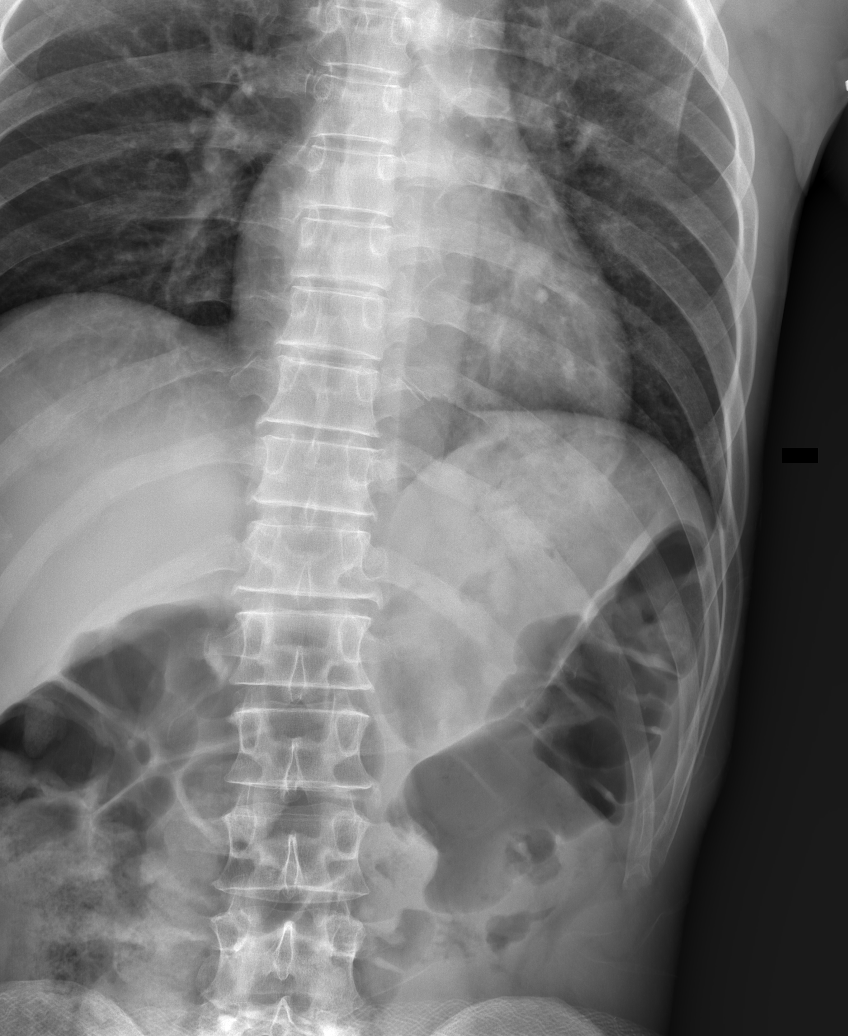

[5 of 5 positions shown; findings below may reference images not displayed]

FINDINGS: There is an acute minimally displaced fracture of the lateral
eleventh rib on the left. There is no pneumothorax. The lungs are
clear. Heart size is normal. There is some pleuroparenchymal
scarring at the lung apices.
IMPRESSION: Acute minimally displaced fracture of the lateral eleventh rib on
the left. No pneumothorax.

## 2022-10-01 ENCOUNTER — Other Ambulatory Visit: Payer: Self-pay

## 2022-10-01 ENCOUNTER — Emergency Department (HOSPITAL_COMMUNITY): Payer: Medicaid Other

## 2022-10-01 ENCOUNTER — Emergency Department (HOSPITAL_COMMUNITY)
Admission: EM | Admit: 2022-10-01 | Discharge: 2022-10-01 | Disposition: A | Discharge status: Home / Self Care | Payer: Medicaid Other | Attending: Emergency Medicine | Admitting: Emergency Medicine

## 2022-10-01 ENCOUNTER — Encounter (HOSPITAL_COMMUNITY): Payer: Self-pay | Admitting: *Deleted

## 2022-10-01 DIAGNOSIS — W208XXA Other cause of strike by thrown, projected or falling object, initial encounter: Secondary | ICD-10-CM | POA: Diagnosis not present

## 2022-10-01 DIAGNOSIS — S92301A Fracture of unspecified metatarsal bone(s), right foot, initial encounter for closed fracture: Secondary | ICD-10-CM | POA: Diagnosis not present

## 2022-10-01 DIAGNOSIS — S99921A Unspecified injury of right foot, initial encounter: Secondary | ICD-10-CM | POA: Diagnosis present

## 2022-10-01 DIAGNOSIS — Z7982 Long term (current) use of aspirin: Secondary | ICD-10-CM | POA: Insufficient documentation

## 2022-10-01 MED ORDER — OXYCODONE-ACETAMINOPHEN 5-325 MG PO TABS: 1.0000 | ORAL_TABLET | Freq: Four times a day (QID) | ORAL | 0 refills | Status: AC | PRN

## 2022-10-01 MED ORDER — OXYCODONE-ACETAMINOPHEN 5-325 MG PO TABS
1.0000 | ORAL_TABLET | Freq: Once | ORAL | Status: AC
  Administered 2022-10-01: 1 via ORAL
  Filled 2022-10-01: qty 1

## 2022-10-01 MED ORDER — OXYCODONE-ACETAMINOPHEN 5-325 MG PO TABS
2.0000 | ORAL_TABLET | Freq: Once | ORAL | Status: AC
  Administered 2022-10-01: 1 via ORAL
  Filled 2022-10-01: qty 2

## 2022-10-01 NOTE — ED Notes (Signed)
Ice applied to right foot at this time

## 2022-10-01 NOTE — ED Notes (Signed)
AVS reviewed with pt prior to discharge. Pt verbalizes understanding. Belongings with pt upon depart. Pt ambulatory with crutches to lobby to wait for ride

## 2022-10-01 NOTE — ED Provider Notes (Signed)
[facility] Provider Note   CSN: [id number] Arrival date & time: 10/01/22  1629     History  Chief Complaint  Patient presents with   Foot Injury    Willie Price is a 57 y.o. male here presenting with right foot injury.  Patient states that he was helping his friend hooking up a trailer and accidentally lost control and the trailer hit him directly on his right foot.  Denies any other injuries.  No meds prior to arrival.  He states that the foot is very swollen now.  The history is provided by the patient.       Home Medications Prior to Admission medications   Medication Sig Start Date End Date Taking? Authorizing Provider  Aspirin-Salicylamide-Caffeine (BC HEADACHE POWDER PO) Take by mouth.    [provider]  meloxicam (MOBIC) 7.5 MG tablet Take 1 tablet (7.5 mg total) by mouth daily. 10/28/20   Pearson Forster, NP      Allergies    Patient has no known allergies.    Review of Systems   Review of Systems  Musculoskeletal:        Right foot pain  All other systems reviewed and are negative.   Physical Exam Updated Vital Signs BP 109/77   Pulse 68   Temp 98 F (36.7 C)   Resp 18   Ht 5\' 8"  (1.727 m)   Wt 61.2 kg   SpO2 98%   BMI 20.51 kg/m  Physical Exam Vitals and nursing note reviewed.  Constitutional:      Comments: Uncomfortable  HENT:     Head: Normocephalic.     Nose: Nose normal.     Mouth/Throat:     Mouth: Mucous membranes are moist.  Eyes:     Pupils: Pupils are equal, round, and reactive to light.  Cardiovascular:     Rate and Rhythm: Normal rate.     Pulses: Normal pulses.     Heart sounds: Normal heart sounds.  Pulmonary:     Breath sounds: Normal breath sounds.  Abdominal:     General: Abdomen is flat.     Palpations: Abdomen is soft.  Musculoskeletal:     Cervical back: Normal range of motion.     Comments: Right foot swollen and diffuse tenderness in the midfoot.  Patient  is able to wiggle his toes.  No ankle tenderness or pain or deformity.  Patient has 2+ DP pulses and normal capillary refill  Skin:    Capillary Refill: Capillary refill takes less than 2 seconds.  Neurological:     General: No focal deficit present.     Mental Status: He is alert and oriented to person, place, and time.  Psychiatric:        Mood and Affect: Mood normal.        Behavior: Behavior normal.     ED Results / Procedures / Treatments   Labs (all labs ordered are listed, but only abnormal results are displayed) Labs Reviewed - No data to display  EKG None  Radiology No results found.  Procedures Procedures    Medications Ordered in ED Medications  oxyCODONE-acetaminophen (PERCOCET/[name]) 5-325 MG per tablet 2 tablet (2 tablets Oral Given 10/01/22 1710)    ED Course/ Medical Decision Making/ A&P                             Medical Decision Making  ROC TEEM is a 57 y.o. male here with crush injury to the right foot.  Concern for possible midfoot fracture versus contusion.  Plan to get right foot x-ray and give pain medicine.  6:19 PM X-ray showed nondisplaced fractures of the second and third and fourth metatarsal shafts.  Patient was given postop shoe and crutches.  Pain is controlled with pain medicine.  Will have him follow-up with Ortho outpatient.  Problems Addressed: Closed nondisplaced fracture of metatarsal bone of right foot, unspecified metatarsal, initial encounter: acute illness or injury  Amount and/or Complexity of Data Reviewed Radiology: ordered and independent interpretation performed. Decision-making details documented in ED Course.  Risk Prescription drug management.    Final Clinical Impression(s) / ED Diagnoses Final diagnoses:  None    Rx / DC Orders ED Discharge Orders     None         Drenda Freeze, MD 10/01/22 1820

## 2022-10-01 NOTE — ED Triage Notes (Signed)
The pt reports that a trailor fell onto his rt foot approx on hour ago.. redness and sl swollen

## 2022-10-01 NOTE — Discharge Instructions (Signed)
As we discussed, you have metatarsal bone fracture of the second and third and fourth   Please wear postop shoe and avoid walking on the right foot.  Please use crutches  Take Tylenol and Motrin for pain and take Percocet for severe pain.  Please apply ice for swelling  Please call Dr. Debroah Loop office on Monday for follow-up  Return to ER if you have worse foot pain or foot turning numb

## 2022-10-01 NOTE — Progress Notes (Signed)
Orthopedic Tech Progress Note Patient Details:  Willie Price Aug 25, 1965 [id number]  Ortho Devices Type of Ortho Device: Crutches, Postop shoe/boot Ortho Device/Splint Location: Right foot Ortho Device/Splint Interventions: Application   Post Interventions Patient Tolerated: Well, Ambulated well  Willie Price [name] 10/01/2022, 6:29 PM
